# Patient Record
Sex: Male | Born: 1945
Health system: Southern US, Community
[De-identification: ages and names within clinical notes are randomized; demographics above are authoritative.]

## PROBLEM LIST (undated history)

## (undated) DIAGNOSIS — I10 Essential (primary) hypertension: Secondary | ICD-10-CM

## (undated) DIAGNOSIS — Z8719 Personal history of other diseases of the digestive system: Secondary | ICD-10-CM

## (undated) DIAGNOSIS — C801 Malignant (primary) neoplasm, unspecified: Secondary | ICD-10-CM

## (undated) DIAGNOSIS — R350 Frequency of micturition: Secondary | ICD-10-CM

## (undated) DIAGNOSIS — E611 Iron deficiency: Secondary | ICD-10-CM

## (undated) DIAGNOSIS — N289 Disorder of kidney and ureter, unspecified: Secondary | ICD-10-CM

## (undated) DIAGNOSIS — K529 Noninfective gastroenteritis and colitis, unspecified: Secondary | ICD-10-CM

## (undated) DIAGNOSIS — M1990 Unspecified osteoarthritis, unspecified site: Secondary | ICD-10-CM

## (undated) DIAGNOSIS — N400 Enlarged prostate without lower urinary tract symptoms: Secondary | ICD-10-CM

## (undated) DIAGNOSIS — R351 Nocturia: Secondary | ICD-10-CM

## (undated) HISTORY — DX: Noninfective gastroenteritis and colitis, unspecified: K52.9

## (undated) HISTORY — PX: ABDOMINAL SURGERY: SHX537

---

## 2008-01-04 HISTORY — PX: OTHER SURGICAL HISTORY: SHX169

## 2011-01-03 ENCOUNTER — Other Ambulatory Visit: Payer: Self-pay | Admitting: Urology

## 2011-01-25 ENCOUNTER — Encounter (HOSPITAL_BASED_OUTPATIENT_CLINIC_OR_DEPARTMENT_OTHER): Payer: Self-pay | Admitting: *Deleted

## 2011-01-25 NOTE — Progress Notes (Signed)
NPO AFTER MN. ARRIVES AT 0615. NEEDS HH AND EKG. REVIEWED [facility] GUIDELINES.

## 2011-01-28 NOTE — H&P (Signed)
History of Present Illness  Patient is here today for TURP. History below:  Thomas Mcclain returns for follow-up. He is status post his urodynamics. Again, we started seeing him earlier this year with some longstanding and progressive voiding symptoms. The patient did get some improvement with alpha-blocker therapy as well as some daily Cialis. When I saw him last time, however, he was interested potentially in pursuing things a little bit further. He still had nights where he was getting up 5 and 6 times per evening and while his stream was better it was still quite a bit on the weak side, at least occasionally. We felt that given the ongoing symptoms, that it would be appropriate to do additional assessment. Cystoscopically he was felt to have some outlet obstruction. The patient subsequently underwent video urodynamics.   The patient appeared to have difficult bladder emptying. He clearly had a hypersensitive bladder with some very mild instability but it was really of low pressure and no leakage occurred. On pressure flow studies the patient was clearly in the obstructed category with a void close to 250 cc and a maximum flow of 6 mL per second with a detrusor pressure of 81 cm of water pressure. Again, bladder emptying was good. There was no evidence of any external sphincter problems. In summary, he had a somewhat reduced functional bladder capacity that was clearly hypersensitive with very minimal instability. Pressure flow pattern was clearly obstructive. His clinical situation is unchanged since his last visit.    Past Medical History Problems  1. History of  Heartburn 787.1  Surgical History Problems  1. History of  Foot Surgery Left  Current Meds 1. Aspirin 81 MG Oral Tablet; Therapy: (Recorded:30Mar2012) to 2. Cialis 5 MG Oral Tablet; Take 1 tablet daily; Therapy: 30Mar2012 to (Evaluate:26Oct2012); Last  Rx:30Mar2012 3. Prevacid 15 MG Oral Capsule Delayed Release; Therapy:  (Recorded:26Nov2012) to 4. Tamsulosin HCl 0.4 MG Oral Capsule; TAKE 1 CAPSULE EVERY DAY; Therapy: 30Mar2012 to  (Last Rx:30Mar2012)  Allergies No Known Allergies  1. No Known Allergies  Family History Problems  1. Family history of  Family Health Status - Father's Age 79 2. Family history of  Family Health Status - Mother's Age 26 3. Family history of  Family Health Status Number Of Children 4 sons/ 1 daughter  Social History Problems  1. Marital History - Currently Married 2. Tobacco Use 305.1 smoked 2 pks for 20 yrs/ quit 33 yrs ago Denied  3. History of  Alcohol Use 4. History of  Caffeine Use  Review of Systems Genitourinary, constitutional, skin, eye, otolaryngeal, hematologic/lymphatic, cardiovascular, pulmonary, endocrine, musculoskeletal, gastrointestinal, neurological and psychiatric system(s) were reviewed and pertinent findings if present are noted.  Genitourinary: urinary frequency, feelings of urinary urgency, nocturia, difficulty starting the urinary stream, weak urinary stream, urinary stream starts and stops, incomplete emptying of bladder and erectile dysfunction, but no hematuria.  Gastrointestinal: heartburn.  Integumentary: pruritus.    Vitals Vital Signs [Data Includes: Last 1 Day]  27Dec2012 02:34PM  Blood Pressure: 127 / 78 Temperature: 98.4 F Heart Rate: 86  Physical Exam Constitutional: Well nourished and well developed . No acute distress.  ENT:. The ears and nose are normal in appearance.  Neck: The appearance of the neck is normal and no neck mass is present.  Pulmonary: No respiratory distress and normal respiratory rhythm and effort.  Cardiovascular: Heart rate and rhythm are normal . No peripheral edema.  Abdomen: The abdomen is soft and nontender. No masses are palpated. No CVA tenderness.  No hernias are palpable. No hepatosplenomegaly noted.  Genitourinary: Examination of the penis demonstrates no discharge, no masses, no lesions and a  normal meatus. The scrotum is without lesions. The right epididymis is palpably normal and non-tender. The left epididymis is palpably normal and non-tender. The right testis is non-tender and without masses. The left testis is non-tender and without masses.  Skin: Normal skin turgor, no visible rash and no visible skin lesions.    Assessment Assessed  1. Benign Prostatic Hypertrophy With Urinary Obstruction 600.01 2. Male Erectile Disorder Due To Physical Condition 607.84  Plan Benign Prostatic Hypertrophy With Urinary Obstruction (600.01)  1. Follow-up Schedule Surgery Office  Follow-up  Done: 27Dec2012  Discussion/Summary   Thomas Mcclain has had some very longstanding and progressive voiding symptoms. I think that both clinically, cystoscopically and based on urodynamics, he clearly has outlet obstruction for benign prostatic hyperplasia and I think his data is fairly compelling for that. Things are better on medical therapy but they are no where he wants to be. He is emptying his bladder and he understands that he is not at a point where he has to have something done, but he is quite interested in doing the next step towards a more definitive treatment of his problem. I do think he would be a very good candidate for a saline Gyrus TURP. Cystoscopically I felt that the majority of his obstruction appeared to be coming from a prominent median bar and we would hope to be able to just incise and/or resect that and potentially do a limited TUR. I think given his current preoperative numbers, he would have an excellent but certainly not 100% chance of substantial improvement in his voiding . Hopefully he could get off medical therapy and his situation would improve dramatically. We talked about the procedure, the recovery as well as some of the potential effects, good and bad. We did discuss retrograde ejaculation. Of note, he is currently enrolled in the ED study here in our office. We will arrange for surgery  sometime in the next several weeks based on review of his and my schedules.       Signatures Electronically signed by : Barron Alvine, M.D.; Dec 31 2010 11:46AM

## 2011-01-31 ENCOUNTER — Encounter (HOSPITAL_BASED_OUTPATIENT_CLINIC_OR_DEPARTMENT_OTHER): Payer: Self-pay | Admitting: Anesthesiology

## 2011-01-31 ENCOUNTER — Ambulatory Visit (HOSPITAL_BASED_OUTPATIENT_CLINIC_OR_DEPARTMENT_OTHER)
Admission: RE | Admit: 2011-01-31 | Discharge: 2011-02-01 | Disposition: A | Discharge status: Home / Self Care | Payer: BC Managed Care – PPO | Source: Ambulatory Visit | Attending: Urology | Admitting: Urology

## 2011-01-31 ENCOUNTER — Encounter (HOSPITAL_BASED_OUTPATIENT_CLINIC_OR_DEPARTMENT_OTHER): Payer: Self-pay | Admitting: *Deleted

## 2011-01-31 ENCOUNTER — Other Ambulatory Visit: Payer: Self-pay

## 2011-01-31 ENCOUNTER — Ambulatory Visit (HOSPITAL_BASED_OUTPATIENT_CLINIC_OR_DEPARTMENT_OTHER): Payer: BC Managed Care – PPO | Admitting: Anesthesiology

## 2011-01-31 ENCOUNTER — Other Ambulatory Visit: Payer: Self-pay | Admitting: Urology

## 2011-01-31 ENCOUNTER — Encounter (HOSPITAL_BASED_OUTPATIENT_CLINIC_OR_DEPARTMENT_OTHER)
Admission: RE | Disposition: A | Discharge status: Home / Self Care | Payer: Self-pay | Source: Ambulatory Visit | Attending: Urology

## 2011-01-31 DIAGNOSIS — N400 Enlarged prostate without lower urinary tract symptoms: Secondary | ICD-10-CM | POA: Diagnosis present

## 2011-01-31 DIAGNOSIS — N138 Other obstructive and reflux uropathy: Secondary | ICD-10-CM | POA: Insufficient documentation

## 2011-01-31 DIAGNOSIS — Z7982 Long term (current) use of aspirin: Secondary | ICD-10-CM | POA: Insufficient documentation

## 2011-01-31 DIAGNOSIS — R12 Heartburn: Secondary | ICD-10-CM | POA: Insufficient documentation

## 2011-01-31 DIAGNOSIS — N320 Bladder-neck obstruction: Secondary | ICD-10-CM | POA: Insufficient documentation

## 2011-01-31 DIAGNOSIS — Z79899 Other long term (current) drug therapy: Secondary | ICD-10-CM | POA: Insufficient documentation

## 2011-01-31 DIAGNOSIS — N401 Enlarged prostate with lower urinary tract symptoms: Secondary | ICD-10-CM | POA: Insufficient documentation

## 2011-01-31 HISTORY — DX: Nocturia: R35.1

## 2011-01-31 HISTORY — PX: TRANSURETHRAL RESECTION OF PROSTATE: SHX73

## 2011-01-31 HISTORY — DX: Iron deficiency: E61.1

## 2011-01-31 HISTORY — DX: Benign prostatic hyperplasia without lower urinary tract symptoms: N40.0

## 2011-01-31 HISTORY — DX: Frequency of micturition: R35.0

## 2011-01-31 SURGERY — TRANSURETHRAL RESECTION OF THE PROSTATE WITH GYRUS INSTRUMENTS
Anesthesia: General

## 2011-01-31 MED ORDER — FENTANYL CITRATE 0.05 MG/ML IJ SOLN
INTRAMUSCULAR | Status: DC | PRN
  Administered 2011-01-31 (×4): 50 ug via INTRAVENOUS

## 2011-01-31 MED ORDER — CIPROFLOXACIN IN D5W 400 MG/200ML IV SOLN
400.0000 mg | INTRAVENOUS | Status: AC
  Administered 2011-01-31: 400 mg via INTRAVENOUS

## 2011-01-31 MED ORDER — DOCUSATE SODIUM 100 MG PO CAPS
100.0000 mg | ORAL_CAPSULE | Freq: Two times a day (BID) | ORAL | Status: DC
  Administered 2011-01-31: 100 mg via ORAL

## 2011-01-31 MED ORDER — LACTATED RINGERS IV SOLN
INTRAVENOUS | Status: DC
  Administered 2011-01-31: 07:00:00 via INTRAVENOUS

## 2011-01-31 MED ORDER — KCL IN DEXTROSE-NACL 20-5-0.45 MEQ/L-%-% IV SOLN
INTRAVENOUS | Status: DC
  Administered 2011-01-31 (×2): via INTRAVENOUS

## 2011-01-31 MED ORDER — PROPOFOL 10 MG/ML IV EMUL
INTRAVENOUS | Status: DC | PRN
  Administered 2011-01-31: 180 mg via INTRAVENOUS

## 2011-01-31 MED ORDER — FENTANYL CITRATE 0.05 MG/ML IJ SOLN: 25.0000 ug | INTRAMUSCULAR | Status: DC | PRN

## 2011-01-31 MED ORDER — OXYBUTYNIN CHLORIDE 5 MG PO TABS: 5.0000 mg | ORAL_TABLET | Freq: Three times a day (TID) | ORAL | Status: DC | PRN

## 2011-01-31 MED ORDER — MIDAZOLAM HCL 5 MG/5ML IJ SOLN
INTRAMUSCULAR | Status: DC | PRN
  Administered 2011-01-31: 2 mg via INTRAVENOUS

## 2011-01-31 MED ORDER — TAMSULOSIN HCL 0.4 MG PO CAPS
0.4000 mg | ORAL_CAPSULE | Freq: Every day | ORAL | Status: DC
  Administered 2011-01-31: 0.4 mg via ORAL

## 2011-01-31 MED ORDER — PROMETHAZINE HCL 25 MG/ML IJ SOLN: 6.2500 mg | INTRAMUSCULAR | Status: DC | PRN

## 2011-01-31 MED ORDER — ONDANSETRON HCL 4 MG/2ML IJ SOLN: 4.0000 mg | INTRAMUSCULAR | Status: DC | PRN

## 2011-01-31 MED ORDER — STERILE WATER FOR IRRIGATION IR SOLN
Status: DC | PRN
  Administered 2011-01-31: 1

## 2011-01-31 MED ORDER — MORPHINE SULFATE 2 MG/ML IJ SOLN: 2.0000 mg | INTRAMUSCULAR | Status: DC | PRN

## 2011-01-31 MED ORDER — LIDOCAINE HCL 2 % EX GEL
CUTANEOUS | Status: DC | PRN
  Administered 2011-01-31: 1 via URETHRAL

## 2011-01-31 MED ORDER — ONDANSETRON HCL 4 MG/2ML IJ SOLN
INTRAMUSCULAR | Status: DC | PRN
  Administered 2011-01-31: 4 mg via INTRAVENOUS

## 2011-01-31 MED ORDER — HYDROCODONE-ACETAMINOPHEN 5-325 MG PO TABS
1.0000 | ORAL_TABLET | ORAL | Status: DC | PRN
  Administered 2011-01-31 – 2011-02-01 (×4): 2 via ORAL

## 2011-01-31 MED ORDER — SODIUM CHLORIDE 0.9 % IR SOLN
Status: DC | PRN
  Administered 2011-01-31: 3000 mL

## 2011-01-31 MED ORDER — CIPROFLOXACIN HCL 500 MG PO TABS
500.0000 mg | ORAL_TABLET | Freq: Two times a day (BID) | ORAL | Status: DC
  Administered 2011-01-31: 500 mg via ORAL

## 2011-01-31 MED ORDER — BELLADONNA ALKALOIDS-OPIUM 16.2-60 MG RE SUPP
RECTAL | Status: DC | PRN
  Administered 2011-01-31: 1 via RECTAL

## 2011-01-31 MED ORDER — LACTATED RINGERS IV SOLN: INTRAVENOUS | Status: DC

## 2011-01-31 MED ORDER — DEXAMETHASONE SODIUM PHOSPHATE 4 MG/ML IJ SOLN
INTRAMUSCULAR | Status: DC | PRN
  Administered 2011-01-31: 4 mg via INTRAVENOUS

## 2011-01-31 MED ORDER — LIDOCAINE HCL (CARDIAC) 20 MG/ML IV SOLN
INTRAVENOUS | Status: DC | PRN
  Administered 2011-01-31: 100 mg via INTRAVENOUS

## 2011-01-31 SURGICAL SUPPLY — 28 items
BAG DRAIN URO-CYSTO SKYTR STRL (DRAIN) ×2 IMPLANT
BAG URINE DRAINAGE (UROLOGICAL SUPPLIES) IMPLANT
BAG URINE LEG 19OZ MD ST LTX (BAG) IMPLANT
CANISTER SUCT LVC 12 LTR MEDI- (MISCELLANEOUS) ×4 IMPLANT
CATH FOLEY 2WAY SLVR  5CC 20FR (CATHETERS)
CATH FOLEY 2WAY SLVR  5CC 22FR (CATHETERS)
CATH FOLEY 2WAY SLVR 30CC 22FR (CATHETERS) ×2 IMPLANT
CATH FOLEY 2WAY SLVR 5CC 20FR (CATHETERS) IMPLANT
CATH FOLEY 2WAY SLVR 5CC 22FR (CATHETERS) IMPLANT
CATH FOLEY 3WAY 30CC 22F (CATHETERS) IMPLANT
CLOTH BEACON ORANGE TIMEOUT ST (SAFETY) ×2 IMPLANT
DRAPE CAMERA CLOSED 9X96 (DRAPES) ×2 IMPLANT
ELECT BUTTON HF 24-28F 2 30DE (ELECTRODE) IMPLANT
ELECT LOOP MED HF 24F 12D CBL (CLIP) ×2 IMPLANT
ELECT REM PT RETURN 9FT ADLT (ELECTROSURGICAL)
ELECT RESECT VAPORIZE 12D CBL (ELECTRODE) IMPLANT
ELECTRODE REM PT RTRN 9FT ADLT (ELECTROSURGICAL) IMPLANT
EVACUATOR MICROVAS BLADDER (UROLOGICAL SUPPLIES) IMPLANT
GLOVE BIO SURGEON STRL SZ7.5 (GLOVE) ×2 IMPLANT
GLOVE BIOGEL M 6.5 STRL (GLOVE) ×2 IMPLANT
GLOVE ECLIPSE 6.0 STRL STRAW (GLOVE) ×2 IMPLANT
GOWN STRL REIN XL XLG (GOWN DISPOSABLE) ×2 IMPLANT
HOLDER FOLEY CATH W/STRAP (MISCELLANEOUS) ×2 IMPLANT
KIT ASPIRATION TUBING (SET/KITS/TRAYS/PACK) ×4 IMPLANT
PACK CYSTOSCOPY (CUSTOM PROCEDURE TRAY) ×2 IMPLANT
PLUG CATH AND CAP STER (CATHETERS) IMPLANT
SYR 30ML LL (SYRINGE) ×2 IMPLANT
SYRINGE IRR TOOMEY STRL 70CC (SYRINGE) ×2 IMPLANT

## 2011-01-31 NOTE — Op Note (Signed)
Preoperative diagnosis: BPH Postoperative diagnosis: Same  Procedure: TURP with gyrus   Surgeon: Valetta Fuller M.D.  Anesthesia: Gen.  Indications: Patient has had long-standing progressive voiding symptoms. He has been on alpha blocker therapy as well as daily Cialis. This has resulted in overall improvement in his voiding status but not to the satisfaction of the patient. Video urodynamics were performed which on pressure flow studies did confirm outlet obstruction. The patient underwent consultation with regard to treatment options and elected to proceed with TURP. He appeared to understand a distinct advantages as well as disadvantages and potential complications of this procedure. He is received perioperative antibiotics and placement of PAS compression boots.     Technique and findings: Patient was brought to the operating room where he had successful induction of general anesthesia. He was placed in lithotomy position and then prepped and draped in usual manner. The 77 French continuous flow resectoscope was utilized. The gyrus instrumentation was used. Saline was used as an irrigant. The patient had trilobar hyperplasia with visual obstruction and a very high riding median bar. Prostatic urethra measured about 3 cm. Ureteral orifices were easily identified and preserved throughout the resection. Initial resection was performed at the level of the bladder neck which was then taken down the capsular fibers circumferentially. The right lobe of the prostate was then taken down from anterior to posterior out to the verumontanum the prostate. Extreme apical tissue was not resected. The same procedure was done on the contralateral side. Prostate chips were removed and sent for pathologic analysis. A 22 French Foley catheter was inserted and urine was light pink at the completion of the procedure. No obvious complications occurred the patient was brought to recovery room in stable condition.

## 2011-01-31 NOTE — Anesthesia Procedure Notes (Signed)
Procedure Name: LMA Insertion Performed by: Seraphim Affinito Pre-anesthesia Checklist: Patient identified, Emergency Drugs available, Suction available and Patient being monitored Patient Re-evaluated:Patient Re-evaluated prior to inductionOxygen Delivery Method: Circle System Utilized Preoxygenation: Pre-oxygenation with 100% oxygen Intubation Type: IV induction Ventilation: Mask ventilation without difficulty LMA: LMA inserted LMA Size: 5.0 Number of attempts: 1 Airway Equipment and Method: bite block Placement Confirmation: positive ETCO2 Dental Injury: Teeth and Oropharynx as per pre-operative assessment      

## 2011-01-31 NOTE — Interval H&P Note (Signed)
History and Physical Interval Note:  01/31/2011 7:35 AM  Thomas Mcclain  has presented today for surgery, with the diagnosis of Benign Prostatic Hypertrophy  The various methods of treatment have been discussed with the patient and family. After consideration of risks, benefits and other options for treatment, the patient has consented to  Procedure(s): TRANSURETHRAL RESECTION OF THE PROSTATE WITH GYRUS INSTRUMENTS as a surgical intervention .  The patients' history has been reviewed, patient examined, no change in status, stable for surgery.  I have reviewed the patients' chart and labs.  Questions were answered to the patient's satisfaction.     [name] S

## 2011-01-31 NOTE — Transfer of Care (Signed)
Immediate Anesthesia Transfer of Care Note  Patient: Thomas Mcclain  Procedure(s) Performed:  TRANSURETHRAL RESECTION OF THE PROSTATE WITH GYRUS INSTRUMENTS - GYRUS OWER  Patient Location: PACU  Anesthesia Type: General  Level of Consciousness: awake, alert  and oriented  Airway & Oxygen Therapy: Patient Spontanous Breathing and Patient connected to face mask oxygen  Post-op Assessment: Report given to PACU RN  Post vital signs: Reviewed and stable  Complications: No apparent anesthesia complications

## 2011-01-31 NOTE — Anesthesia Postprocedure Evaluation (Signed)
Anesthesia Post Note  Patient: Thomas Mcclain  Procedure(s) Performed:  TRANSURETHRAL RESECTION OF THE PROSTATE WITH GYRUS INSTRUMENTS - GYRUS OWER  Anesthesia type: General  Patient location: PACU  Post pain: Pain level controlled  Post assessment: Post-op Vital signs reviewed  Last Vitals:  Filed Vitals:   01/31/11 0900  BP: 104/81  Pulse: 52  Temp:   Resp: 14    Post vital signs: Reviewed  Level of consciousness: sedated  Complications: No apparent anesthesia complications

## 2011-01-31 NOTE — Anesthesia Preprocedure Evaluation (Addendum)
Anesthesia Evaluation  Patient identified by MRN, date of birth, ID band Patient awake    Reviewed: Allergy & Precautions, H&P , NPO status , Patient's Chart, lab work & pertinent test results  History of Anesthesia Complications Negative for: history of anesthetic complications  Airway Mallampati: II TM Distance: <3 FB Neck ROM: Full  Mouth opening: Limited Mouth Opening  Dental No notable dental hx. (+) Teeth Intact and Edentulous Upper   Pulmonary neg pulmonary ROS,  clear to auscultation  Pulmonary exam normal       Cardiovascular neg cardio ROS Regular Normal EKG consistent with prior anterior MI; patient denies any prior history of cardiac symptoms.   Neuro/Psych Negative Neurological ROS  Negative Psych ROS   GI/Hepatic negative GI ROS, Neg liver ROS,   Endo/Other  Negative Endocrine ROS  Renal/GU negative Renal ROS  Genitourinary negative   Musculoskeletal negative musculoskeletal ROS (+)   Abdominal   Peds negative pediatric ROS (+)  Hematology negative hematology ROS (+)   Anesthesia Other Findings Decreased oral radius with anterior larynx per exam; ? Difficult airway  Reproductive/Obstetrics negative OB ROS                           Anesthesia Physical Anesthesia Plan  ASA: II  Anesthesia Plan: General   Post-op Pain Management:    Induction: Intravenous  Airway Management Planned: LMA  Additional Equipment:   Intra-op Plan:   Post-operative Plan: Extubation in OR  Informed Consent: I have reviewed the patients History and Physical, chart, labs and discussed the procedure including the risks, benefits and alternatives for the proposed anesthesia with the patient or authorized representative who has indicated his/her understanding and acceptance.   Dental advisory given  Plan Discussed with: CRNA  Anesthesia Plan Comments:         Anesthesia Quick  Evaluation

## 2011-02-01 ENCOUNTER — Encounter (HOSPITAL_BASED_OUTPATIENT_CLINIC_OR_DEPARTMENT_OTHER): Payer: Self-pay | Admitting: Urology

## 2011-02-01 LAB — BASIC METABOLIC PANEL
CO2: 26 mEq/L (ref 19–32)
Chloride: 101 mEq/L (ref 96–112)
GFR calc Af Amer: >90 mL/min (ref >90)
Potassium: 4 mEq/L (ref 3.5–5.1)

## 2011-02-01 LAB — HEMOGLOBIN AND HEMATOCRIT, BLOOD
HCT: 36.7 % — ABNORMAL LOW (ref 39.0–52.0)
Hemoglobin: 12.4 g/dL — ABNORMAL LOW (ref 13.0–17.0)

## 2011-02-01 MED ORDER — CIPROFLOXACIN HCL 500 MG PO TABS: 250.0000 mg | ORAL_TABLET | Freq: Two times a day (BID) | ORAL | Status: AC

## 2011-02-01 MED ORDER — HYDROCODONE-ACETAMINOPHEN 5-325 MG PO TABS: 1.0000 | ORAL_TABLET | ORAL | Status: AC | PRN

## 2011-02-01 NOTE — Progress Notes (Signed)
Patient ID: Colsen Modi, male   DOB: [birth year], 66 y.o.   MRN: [id number] 1 Day Post-Op Subjective: Patient reports no pain. Doing well.  Objective: Vital signs in last 24 hours: Temp:  [96.7 F (35.9 C)-96.9 F (36.1 C)] 96.9 F (36.1 C) (01/29 0615) Pulse Rate:  [51-95] 72  (01/29 0615) Resp:  [7-30] 18  (01/29 0615) BP: (103-130)/(62-87) 114/72 mmHg (01/29 0615) SpO2:  [95 %-100 %] 95 % (01/29 0615)  Intake/Output from previous day: 01/28 0701 - 01/29 0700 In: 5062 [P.O.:3360; I.V.:1702] Out: 4875 [Urine:4875] Intake/Output this shift:    Physical Exam:  Constitutional: Vital signs reviewed. WD WN in NAD   Eyes: PERRL, No scleral icterus.   Cardiovascular: RRR Pulmonary/Chest: Normal effort Abdominal: Soft. Non-tender, non-distended, bowel sounds are normal, no masses, organomegaly, or guarding present.  Genitourinary:catheter ok  Extremities: No cyanosis or edema   Lab Results:  Basename 02/01/11 0500 01/31/11 0708  HGB 12.4* 14.6  HCT 36.7* 43.0   BMET  Basename 02/01/11 0500 01/31/11 0708  NA 134* 142  K 4.0 4.1  CL 101 --  CO2 26 --  GLUCOSE 133* 93  BUN 8 --  CREATININE 0.96 --  CALCIUM 8.4 --   No results found for this basename: LABPT:3,INR:3 in the last 72 hours No results found for this basename: LABURIN:1 in the last 72 hours No results found for this or any previous visit.  Studies/Results: No results found.  Assessment/Plan:   Doing well . Home today with foley.   LOS: 1 day   Ardella Chhim S 02/01/2011, 7:37 AM

## 2012-07-25 ENCOUNTER — Emergency Department (INDEPENDENT_AMBULATORY_CARE_PROVIDER_SITE_OTHER)
Admission: EM | Admit: 2012-07-25 | Discharge: 2012-07-25 | Disposition: A | Discharge status: Home / Self Care | Payer: Medicare Other | Source: Home / Self Care

## 2012-07-25 DIAGNOSIS — I10 Essential (primary) hypertension: Secondary | ICD-10-CM

## 2012-07-25 MED ORDER — LISINOPRIL-HYDROCHLOROTHIAZIDE 20-25 MG PO TABS: 1.0000 | ORAL_TABLET | Freq: Every day | ORAL | Status: DC

## 2012-07-25 NOTE — ED Provider Notes (Signed)
History    CSN: [id number] Arrival date & time 07/25/12  1753  First MD Initiated Contact with Patient 07/25/12 1906     Chief Complaint  Patient presents with  . Hypertension   (Consider location/radiation/quality/duration/timing/severity/associated sxs/prior Treatment) HPI   67 yo wm presents today with the above complaint.  States that he was at a friends house today and he checked his BP which was about 170/100.  Felt somewhat dizzy.  Denies headaches, vision changes, cp, palpitations, sob, nausea, vomiting, GI/GU issues.  Has not had a PCP in a few years and stopped his lisinopril about 2-3 years ago on his own.  Getting ready to start a new job and wants refill of his lisinopril.    Past Medical History  Diagnosis Date  . BPH (benign prostatic hyperplasia)   . Frequency of urination   . Nocturia   . Allergic rhinitis   . Iron deficiency    Past Surgical History  Procedure Laterality Date  . Left ankle debridement and fusion  2010  . Transurethral resection of prostate  01/31/2011    Procedure: TRANSURETHRAL RESECTION OF THE PROSTATE WITH GYRUS INSTRUMENTS;  Surgeon: Valetta Fuller, MD;  Location: [facility];  Service: Urology;  Laterality: N/A;  GYRUS OWER   No family history on file. History  Substance Use Topics  . Smoking status: Former Smoker    Types: Cigarettes    Quit date: 01/24/1977  . Smokeless tobacco: Never Used  . Alcohol Use: 8.4 oz/week    14 Cans of beer per week    Review of Systems  Constitutional: Negative.   HENT: Negative.   Eyes: Negative.   Respiratory: Negative.   Cardiovascular: Negative.   Gastrointestinal: Negative.   Endocrine: Negative.   Musculoskeletal: Negative.   Skin: Negative.   Neurological: Positive for dizziness. Negative for tremors, seizures, syncope, speech difficulty, weakness, light-headedness, numbness and headaches.  Psychiatric/Behavioral: Negative.     Allergies  Review of patient's allergies  indicates no known allergies.  Home Medications   Current Outpatient Rx  Name  Route  Sig  Dispense  Refill  . IRON PO   Oral   Take 1 tablet by mouth daily.         Marland Kitchen lisinopril-hydrochlorothiazide (PRINZIDE,ZESTORETIC) 20-25 MG per tablet   Oral   Take 1 tablet by mouth daily.   30 tablet   0     Must get established with a primary care physician ...   . loratadine-pseudoephedrine (CLARITIN-D 12-HOUR) 5-120 MG per tablet   Oral   Take 1 tablet by mouth as needed.         . Multiple Vitamin (MULTIVITAMIN) tablet   Oral   Take 1 tablet by mouth daily.         . Tamsulosin HCl (FLOMAX) 0.4 MG CAPS   Oral   Take 0.4 mg by mouth daily.          BP 160/100  Pulse 73  Temp(Src) 98.3 F (36.8 C) (Oral)  Resp 18  SpO2 97% Physical Exam  Constitutional: He is oriented to person, place, and time. He appears well-developed and well-nourished.  HENT:  Head: Normocephalic and atraumatic.  Eyes: EOM are normal. Pupils are equal, round, and reactive to light.  Neck: Normal range of motion.  Cardiovascular: Normal rate, regular rhythm and normal heart sounds.   Pulmonary/Chest: Effort normal and breath sounds normal.  Abdominal: Soft.  Musculoskeletal: Normal range of motion.  Neurological: He is alert  and oriented to person, place, and time.  Skin: Skin is warm and dry.  Psychiatric: He has a normal mood and affect.    ED Course  Procedures (including critical care time) Labs Reviewed - No data to display No results found. 1. Hypertension     MDM  Discussed with dr Artis Flock.  Patient given script for lisinopril/hctz 20/25mg  po daily.  No refills.  Advised that he must get established with a primary care and information was given.  Must get renal function rechecked with PCP in a couple of weeks.  Understands the risk of not being compliant with instructions.  All questions answered.    Meds ordered this encounter  Medications  . lisinopril-hydrochlorothiazide  (PRINZIDE,ZESTORETIC) 20-25 MG per tablet    Sig: Take 1 tablet by mouth daily.    Dispense:  30 tablet    Refill:  0    Must get established with a primary care physician and get your renal function rechecked in 2 weeks.    Zonia Kief, PA-C 07/25/12 1934

## 2012-07-25 NOTE — ED Notes (Signed)
Patient states he is here for BP.  Admits to be stressed due to wife health problems.  States he does have slight headache.  Been off of lisinopril for 2-3 years.

## 2012-07-25 NOTE — ED Provider Notes (Signed)
Medical screening examination/treatment/procedure(s) were performed by resident physician or non-physician practitioner and as supervising physician I was immediately available for consultation/collaboration.   Eleanor Dimichele DOUGLAS MD.   Garrison Michie D Ajahni Nay, MD 07/25/12 2030 

## 2012-07-26 LAB — POCT I-STAT, CHEM 8
BUN: 12 mg/dL (ref 6–23)
Chloride: 106 mEq/L (ref 96–112)
Creatinine, Ser: 1.3 mg/dL (ref 0.50–1.35)
Potassium: 4.1 mEq/L (ref 3.5–5.1)
Sodium: 143 mEq/L (ref 135–145)
TCO2: 25 mmol/L (ref 0–100)

## 2012-10-09 ENCOUNTER — Encounter (HOSPITAL_COMMUNITY): Payer: Self-pay | Admitting: Emergency Medicine

## 2012-10-09 ENCOUNTER — Emergency Department (INDEPENDENT_AMBULATORY_CARE_PROVIDER_SITE_OTHER)
Admission: EM | Admit: 2012-10-09 | Discharge: 2012-10-09 | Disposition: A | Discharge status: Home / Self Care | Payer: Medicare Other | Source: Home / Self Care | Attending: Family Medicine | Admitting: Family Medicine

## 2012-10-09 DIAGNOSIS — I10 Essential (primary) hypertension: Secondary | ICD-10-CM | POA: Diagnosis not present

## 2012-10-09 MED ORDER — LISINOPRIL-HYDROCHLOROTHIAZIDE 20-25 MG PO TABS: 1.0000 | ORAL_TABLET | Freq: Every day | ORAL | Status: DC

## 2012-10-09 NOTE — ED Notes (Signed)
Refill on bp medication

## 2012-10-09 NOTE — ED Provider Notes (Signed)
CSN: [id number]     Arrival date & time 10/09/12  1511 History   None    No chief complaint on file.  (Consider location/radiation/quality/duration/timing/severity/associated sxs/prior Treatment) Patient is a 67 y.o. male presenting with hypertension. The history is provided by the patient. No language interpreter was used.  Hypertension This is a chronic problem. The problem occurs constantly. The problem has not changed since onset.Pertinent negatives include no chest pain. Nothing aggravates the symptoms. Nothing relieves the symptoms.  Pt out of bp medication.  [facility]  Past Medical History  Diagnosis Date  . BPH (benign prostatic hyperplasia)   . Frequency of urination   . Nocturia   . Allergic rhinitis   . Iron deficiency    Past Surgical History  Procedure Laterality Date  . Left ankle debridement and fusion  2010  . Transurethral resection of prostate  01/31/2011    Procedure: TRANSURETHRAL RESECTION OF THE PROSTATE WITH GYRUS INSTRUMENTS;  Surgeon: Valetta Fuller, MD;  Location: [facility];  Service: Urology;  Laterality: N/A;  GYRUS OWER   No family history on file. History  Substance Use Topics  . Smoking status: Former Smoker    Types: Cigarettes    Quit date: 01/24/1977  . Smokeless tobacco: Never Used  . Alcohol Use: 8.4 oz/week    14 Cans of beer per week    Review of Systems  Cardiovascular: Negative for chest pain.  All other systems reviewed and are negative.    Allergies  Review of patient's allergies indicates no known allergies.  Home Medications   Current Outpatient Rx  Name  Route  Sig  Dispense  Refill  . IRON PO   Oral   Take 1 tablet by mouth daily.         Marland Kitchen lisinopril-hydrochlorothiazide (PRINZIDE,ZESTORETIC) 20-25 MG per tablet   Oral   Take 1 tablet by mouth daily.   30 tablet   0     Must get established with a primary care physician ...   . loratadine-pseudoephedrine  (CLARITIN-D 12-HOUR) 5-120 MG per tablet   Oral   Take 1 tablet by mouth as needed.         . Multiple Vitamin (MULTIVITAMIN) tablet   Oral   Take 1 tablet by mouth daily.         . Tamsulosin HCl (FLOMAX) 0.4 MG CAPS   Oral   Take 0.4 mg by mouth daily.          BP 127/82  Pulse 75  Temp(Src) 98.1 F (36.7 C) (Oral)  Resp 16  SpO2 99% Physical Exam  Constitutional: He appears well-developed and well-nourished.  HENT:  Head: Normocephalic.  Eyes: Pupils are equal, round, and reactive to light.  Neck: Normal range of motion.  Cardiovascular: Normal rate.   Pulmonary/Chest: Effort normal and breath sounds normal.  Abdominal: Soft.    ED Course  Procedures (including critical care time) Labs Review Labs Reviewed - No data to display Imaging Review No results found.  MDM   1. Hypertension    Lisinopril     Lonia Skinner [place], [place] 10/09/12 1636

## 2012-10-10 NOTE — ED Provider Notes (Signed)
Medical screening examination/treatment/procedure(s) were performed by resident physician or non-physician practitioner and as supervising physician I was immediately available for consultation/collaboration.   Barkley Bruns MD.   Linna Hoff, MD 10/10/12 2110

## 2012-10-17 ENCOUNTER — Ambulatory Visit: Payer: Medicare Other | Attending: Cardiology | Admitting: Internal Medicine

## 2012-10-17 ENCOUNTER — Encounter: Payer: Self-pay | Admitting: Internal Medicine

## 2012-10-17 VITALS — BP 121/78 | HR 70 | Temp 98.0°F | Resp 16 | Ht 72.0 in | Wt 196.0 lb

## 2012-10-17 DIAGNOSIS — D509 Iron deficiency anemia, unspecified: Secondary | ICD-10-CM | POA: Insufficient documentation

## 2012-10-17 DIAGNOSIS — Z0000 Encounter for general adult medical examination without abnormal findings: Secondary | ICD-10-CM | POA: Diagnosis not present

## 2012-10-17 DIAGNOSIS — R7309 Other abnormal glucose: Secondary | ICD-10-CM

## 2012-10-17 DIAGNOSIS — I10 Essential (primary) hypertension: Secondary | ICD-10-CM | POA: Diagnosis not present

## 2012-10-17 DIAGNOSIS — M549 Dorsalgia, unspecified: Secondary | ICD-10-CM | POA: Diagnosis not present

## 2012-10-17 LAB — LIPID PANEL
Cholesterol: 158 mg/dL (ref 0–200)
HDL: 35 mg/dL — ABNORMAL LOW (ref >39)
LDL Cholesterol: 98 mg/dL (ref 0–99)
Total CHOL/HDL Ratio: 4.5 Ratio
Triglycerides: 127 mg/dL (ref <150)
VLDL: 25 mg/dL (ref 0–40)

## 2012-10-17 MED ORDER — TAMSULOSIN HCL 0.4 MG PO CAPS: 0.4000 mg | ORAL_CAPSULE | Freq: Every day | ORAL | Status: DC

## 2012-10-17 MED ORDER — LISINOPRIL-HYDROCHLOROTHIAZIDE 20-25 MG PO TABS: 1.0000 | ORAL_TABLET | Freq: Every day | ORAL | Status: DC

## 2012-10-17 NOTE — Progress Notes (Signed)
Patient ID: Thomas Mcclain, male   DOB: 1945-11-21, 67 y.o.   MRN: [id number] PCP:  Jeanann Lewandowsky, MD   Chief Complaint:  Establish care  HPI: 67 year old me in week history of BPH status post TURP in 2013, hypertension recently started on lisinopril and HCTZ at the urgent care comes in to establish care in the clinic. Patient reports having some sharp pain over his left mid back occasionally radiating to the left upper chest for last 6 to 8 weeks. He thinks that he may have pulled a muscle with exertion and lifting some weights 2 weeks back. He reports having pain on lying down on his back for a long time. The pain is not severe. He denies any headache, blurred vision, dizziness, chest pain, shortness of breath, palpitations, fever, chills, abdominal pain, nausea, vomiting, bowel or urinary symptoms. denies any polyuria, polydipsia, change in his weight or appetite. Reports having a colonoscopy at [facility] about 6 years back and it reported to be normal. Denies any joint pains.  Allergies: No Known Allergies  Prior to Admission medications   Medication Sig Start Date End Date Taking? Authorizing Provider  lisinopril-hydrochlorothiazide (PRINZIDE,ZESTORETIC) 20-25 MG per tablet Take 1 tablet by mouth daily. 10/17/12  Yes Shylah Dossantos, MD  IRON PO Take 1 tablet by mouth daily.    Historical Provider, MD  loratadine-pseudoephedrine (CLARITIN-D 12-HOUR) 5-120 MG per tablet Take 1 tablet by mouth as needed.    Historical Provider, MD  Multiple Vitamin (MULTIVITAMIN) tablet Take 1 tablet by mouth daily.    Historical Provider, MD  tamsulosin (FLOMAX) 0.4 MG CAPS capsule Take 1 capsule (0.4 mg total) by mouth daily. 10/17/12   Eddie North, MD    Past Medical History  Diagnosis Date  . BPH (benign prostatic hyperplasia)   . Frequency of urination   . Nocturia   . Allergic rhinitis   . Iron deficiency     Past Surgical History  Procedure Laterality Date  . Left ankle debridement  and fusion  2010  . Transurethral resection of prostate  01/31/2011    Procedure: TRANSURETHRAL RESECTION OF THE PROSTATE WITH GYRUS INSTRUMENTS;  Surgeon: Valetta Fuller, MD;  Location: [facility];  Service: Urology;  Laterality: N/A;  GYRUS OWER    Social History:  reports that he quit smoking about 35 years ago. His smoking use included Cigarettes. He smoked 0.00 packs per day. He has never used smokeless tobacco. He reports that he drinks about 8.4 ounces of alcohol per week. He reports that he does not use illicit drugs.  Family History  Problem Relation Age of Onset  . Diabetes Mother     Review of Systems:  Constitutional: Denies fever, chills, diaphoresis, appetite change and fatigue.  HEENT: Denies photophobia, eye pain, redness, hearing loss, ear pain, congestion, sore throat, rhinorrhea, sneezing, mouth sores, trouble swallowing, neck pain, neck stiffness and tinnitus.   Respiratory: Denies SOB, DOE, cough, chest tightness,  and wheezing.   Cardiovascular: Denies chest pain, palpitations and leg swelling.  Gastrointestinal: Denies nausea, vomiting, abdominal pain, diarrhea, constipation, blood in stool and abdominal distention.  Genitourinary: Denies dysuria, urgency, frequency, hematuria, flank pain and difficulty urinating.  Endocrine: Denies: hot or cold intolerance, sweats,   polyuria, polydipsia. Musculoskeletal: Sharp pain over mid back,  Denies myalgias,  joint swelling, arthralgias and gait problem.  Skin: Denies pallor, rash and wound.  Neurological: Denies dizziness, seizures, syncope, weakness, light-headedness, numbness and headaches.  Hematological: Denies adenopathy. Psychiatric/Behavioral: Denies suicidal ideation, mood changes,  confusion, nervousness, sleep disturbance and agitation   Physical Exam:  Filed Vitals:   10/17/12 0917  BP: 121/78  Pulse: 70  Temp: 98 F (36.7 C)  TempSrc: Oral  Resp: 16  Height: 6' (1.829 m)  Weight: 196 lb  (88.905 kg)  SpO2: 94%    Constitutional: Vital signs reviewed.  Patient is a well-developed and well-nourished in no acute distress and cooperative with exam. HEENT: No pallor, no icterus, moist oral mucosa, no cervical lymphadenopathy Cardiovascular: RRR, S1 normal, S2 normal, no MRG, Pulmonary/Chest: CTAB, no wheezes, rales, or rhonchi Abdominal: Soft. Non-tender, non-distended, bowel sounds are normal, no masses, organomegaly, or guarding present.  Musculoskeletal: No joint deformities, erythema, or stiffness, ROM full and no nontender Ext: no edema and no cyanosis Neurological: A&O x3, nonfocal  Skin: Warm, dry and intact. No rash, cyanosis, or clubbing.  Psychiatric: Normal mood and affect. speech and behavior is normal. Judgment and thought content normal. Cognition and memory are normal.   Labs from July 2014 reviewed  Radiological Exams on Admission: No results found.  Assessment/Plan Benign essential hypertension Well controlled. Continue lisinopril and HCTZ. We will check repeat CBC and BMP in 6 months. Refill provided.  check lipid panel and A1C today  Back pain Appears musculoskeletal. Advised on range of motion exercise and Tylenol when necessary for pain   BPH Status post TURP P. date continue with Flomax  Iron deficiency anemia Hemoglobin 3 months back was 12.4. The started iron supplements at this time. We will recheck hemoglobin in 6 months.  Health maintenance Reports having a normal colonoscopy 6 months back.  This and plans on starting a new job at [employer] which involves working with machinery in a few weeks and he is medically clear.  follow up in 6 months  Jaeleen Inzunza 10/17/2012, 9:41 AM

## 2012-10-17 NOTE — Progress Notes (Signed)
Pt is here today to establish care. Pt reports having tightness of the chest for 3 weeks. Pt has been treated for HTN Pt is also having lower back pain for 4 weeks its throbbing and radiating down his right leg.

## 2012-10-19 ENCOUNTER — Telehealth: Payer: Self-pay | Admitting: Internal Medicine

## 2013-02-08 DIAGNOSIS — S61209A Unspecified open wound of unspecified finger without damage to nail, initial encounter: Secondary | ICD-10-CM | POA: Diagnosis not present

## 2013-02-08 DIAGNOSIS — G8911 Acute pain due to trauma: Secondary | ICD-10-CM | POA: Diagnosis not present

## 2013-03-26 DIAGNOSIS — H2510 Age-related nuclear cataract, unspecified eye: Secondary | ICD-10-CM | POA: Diagnosis not present

## 2013-03-26 DIAGNOSIS — H40039 Anatomical narrow angle, unspecified eye: Secondary | ICD-10-CM | POA: Diagnosis not present

## 2013-05-23 ENCOUNTER — Encounter (HOSPITAL_COMMUNITY): Payer: Self-pay | Admitting: Emergency Medicine

## 2013-05-23 ENCOUNTER — Encounter (HOSPITAL_COMMUNITY)
Admission: EM | Disposition: A | Discharge status: Home / Self Care | Payer: Self-pay | Source: Home / Self Care | Attending: Internal Medicine

## 2013-05-23 ENCOUNTER — Observation Stay (HOSPITAL_COMMUNITY)
Admission: EM | Admit: 2013-05-23 | Discharge: 2013-05-24 | Disposition: A | Discharge status: Home / Self Care | Payer: Medicare Other | Attending: Internal Medicine | Admitting: Internal Medicine

## 2013-05-23 DIAGNOSIS — R404 Transient alteration of awareness: Secondary | ICD-10-CM | POA: Diagnosis not present

## 2013-05-23 DIAGNOSIS — R748 Abnormal levels of other serum enzymes: Secondary | ICD-10-CM | POA: Diagnosis not present

## 2013-05-23 DIAGNOSIS — K449 Diaphragmatic hernia without obstruction or gangrene: Secondary | ICD-10-CM | POA: Diagnosis present

## 2013-05-23 DIAGNOSIS — K922 Gastrointestinal hemorrhage, unspecified: Secondary | ICD-10-CM | POA: Diagnosis present

## 2013-05-23 DIAGNOSIS — K209 Esophagitis, unspecified without bleeding: Secondary | ICD-10-CM | POA: Diagnosis present

## 2013-05-23 DIAGNOSIS — J309 Allergic rhinitis, unspecified: Secondary | ICD-10-CM | POA: Insufficient documentation

## 2013-05-23 DIAGNOSIS — Z87891 Personal history of nicotine dependence: Secondary | ICD-10-CM | POA: Insufficient documentation

## 2013-05-23 DIAGNOSIS — R42 Dizziness and giddiness: Secondary | ICD-10-CM | POA: Diagnosis not present

## 2013-05-23 DIAGNOSIS — N401 Enlarged prostate with lower urinary tract symptoms: Secondary | ICD-10-CM

## 2013-05-23 DIAGNOSIS — F1010 Alcohol abuse, uncomplicated: Secondary | ICD-10-CM | POA: Insufficient documentation

## 2013-05-23 DIAGNOSIS — K920 Hematemesis: Principal | ICD-10-CM | POA: Insufficient documentation

## 2013-05-23 DIAGNOSIS — R55 Syncope and collapse: Secondary | ICD-10-CM | POA: Diagnosis not present

## 2013-05-23 DIAGNOSIS — R350 Frequency of micturition: Secondary | ICD-10-CM | POA: Insufficient documentation

## 2013-05-23 DIAGNOSIS — D509 Iron deficiency anemia, unspecified: Secondary | ICD-10-CM | POA: Diagnosis present

## 2013-05-23 DIAGNOSIS — D62 Acute posthemorrhagic anemia: Secondary | ICD-10-CM | POA: Diagnosis present

## 2013-05-23 DIAGNOSIS — K259 Gastric ulcer, unspecified as acute or chronic, without hemorrhage or perforation: Secondary | ICD-10-CM | POA: Insufficient documentation

## 2013-05-23 DIAGNOSIS — I10 Essential (primary) hypertension: Secondary | ICD-10-CM | POA: Diagnosis present

## 2013-05-23 DIAGNOSIS — N138 Other obstructive and reflux uropathy: Secondary | ICD-10-CM | POA: Insufficient documentation

## 2013-05-23 HISTORY — DX: Essential (primary) hypertension: I10

## 2013-05-23 HISTORY — PX: ESOPHAGOGASTRODUODENOSCOPY: SHX5428

## 2013-05-23 LAB — I-STAT TROPONIN, ED: TROPONIN I, POC: 0 ng/mL (ref 0.00–0.08)

## 2013-05-23 LAB — CBC
HCT: 28.4 % — ABNORMAL LOW (ref 39.0–52.0)
HCT: 27.7 % — ABNORMAL LOW (ref 39.0–52.0)
HEMATOCRIT: 28.2 % — AB (ref 39.0–52.0)
HEMATOCRIT: 28 % — AB (ref 39.0–52.0)
HEMOGLOBIN: 9.6 g/dL — AB (ref 13.0–17.0)
Hemoglobin: 9.4 g/dL — ABNORMAL LOW (ref 13.0–17.0)
Hemoglobin: 9 g/dL — ABNORMAL LOW (ref 13.0–17.0)
Hemoglobin: 9.1 g/dL — ABNORMAL LOW (ref 13.0–17.0)
MCH: 26 pg (ref 26.0–34.0)
MCH: 25.4 pg — ABNORMAL LOW (ref 26.0–34.0)
MCH: 26.3 pg (ref 26.0–34.0)
MCH: 25.3 pg — AB (ref 26.0–34.0)
MCHC: 33.1 g/dL (ref 30.0–36.0)
MCHC: 32.5 g/dL (ref 30.0–36.0)
MCHC: 34 g/dL (ref 30.0–36.0)
MCHC: 32.5 g/dL (ref 30.0–36.0)
MCV: 78.5 fL (ref 78.0–100.0)
MCV: 78.2 fL (ref 78.0–100.0)
MCV: 77.3 fL — ABNORMAL LOW (ref 78.0–100.0)
MCV: 77.8 fL — AB (ref 78.0–100.0)
PLATELETS: 221 10*3/uL (ref 150–400)
PLATELETS: 206 10*3/uL (ref 150–400)
Platelets: 219 10*3/uL (ref 150–400)
Platelets: 225 10*3/uL (ref 150–400)
RBC: 3.62 MIL/uL — ABNORMAL LOW (ref 4.22–5.81)
RBC: 3.54 MIL/uL — AB (ref 4.22–5.81)
RBC: 3.65 MIL/uL — AB (ref 4.22–5.81)
RBC: 3.6 MIL/uL — AB (ref 4.22–5.81)
RDW: 15.8 % — ABNORMAL HIGH (ref 11.5–15.5)
RDW: 15.7 % — AB (ref 11.5–15.5)
RDW: 15.6 % — ABNORMAL HIGH (ref 11.5–15.5)
RDW: 15.7 % — ABNORMAL HIGH (ref 11.5–15.5)
WBC: 9.4 10*3/uL (ref 4.0–10.5)
WBC: 8.2 10*3/uL (ref 4.0–10.5)
WBC: 8.5 10*3/uL (ref 4.0–10.5)
WBC: 7.6 10*3/uL (ref 4.0–10.5)

## 2013-05-23 LAB — COMPREHENSIVE METABOLIC PANEL
ALK PHOS: 93 U/L (ref 39–117)
ALT: 14 U/L (ref 0–53)
ALT: 11 U/L (ref 0–53)
AST: 20 U/L (ref 0–37)
AST: 17 U/L (ref 0–37)
Albumin: 3.6 g/dL (ref 3.5–5.2)
Albumin: 3.2 g/dL — ABNORMAL LOW (ref 3.5–5.2)
Alkaline Phosphatase: 83 U/L (ref 39–117)
BUN: 35 mg/dL — ABNORMAL HIGH (ref 6–23)
BUN: 29 mg/dL — AB (ref 6–23)
CHLORIDE: 106 meq/L (ref 96–112)
CO2: 22 meq/L (ref 19–32)
CO2: 24 meq/L (ref 19–32)
CREATININE: 1.32 mg/dL (ref 0.50–1.35)
CREATININE: 1.19 mg/dL (ref 0.50–1.35)
Calcium: 8.9 mg/dL (ref 8.4–10.5)
Calcium: 8.4 mg/dL (ref 8.4–10.5)
Chloride: 108 mEq/L (ref 96–112)
GFR calc Af Amer: 63 mL/min — ABNORMAL LOW (ref >90)
GFR calc Af Amer: 71 mL/min — ABNORMAL LOW (ref >90)
GFR, EST NON AFRICAN AMERICAN: 54 mL/min — AB (ref >90)
GFR, EST NON AFRICAN AMERICAN: 61 mL/min — AB (ref >90)
Glucose, Bld: 114 mg/dL — ABNORMAL HIGH (ref 70–99)
Glucose, Bld: 103 mg/dL — ABNORMAL HIGH (ref 70–99)
POTASSIUM: 4.3 meq/L (ref 3.7–5.3)
Potassium: 4.5 mEq/L (ref 3.7–5.3)
Sodium: 141 mEq/L (ref 137–147)
Sodium: 142 mEq/L (ref 137–147)
Total Bilirubin: 0.2 mg/dL — ABNORMAL LOW (ref 0.3–1.2)
Total Protein: 7.1 g/dL (ref 6.0–8.3)
Total Protein: 6.2 g/dL (ref 6.0–8.3)

## 2013-05-23 LAB — URINALYSIS, ROUTINE W REFLEX MICROSCOPIC
BILIRUBIN URINE: NEGATIVE
GLUCOSE, UA: NEGATIVE mg/dL
HGB URINE DIPSTICK: NEGATIVE
Ketones, ur: NEGATIVE mg/dL
Leukocytes, UA: NEGATIVE
Nitrite: NEGATIVE
PH: 5 (ref 5.0–8.0)
Protein, ur: NEGATIVE mg/dL
SPECIFIC GRAVITY, URINE: 1.027 (ref 1.005–1.030)
UROBILINOGEN UA: 0.2 mg/dL (ref 0.0–1.0)

## 2013-05-23 LAB — CBC WITH DIFFERENTIAL/PLATELET
BASOS ABS: 0 10*3/uL (ref 0.0–0.1)
Basophils Relative: 0 % (ref 0–1)
Eosinophils Absolute: 0.6 10*3/uL (ref 0.0–0.7)
Eosinophils Relative: 5 % (ref 0–5)
HEMATOCRIT: 32.5 % — AB (ref 39.0–52.0)
HEMOGLOBIN: 10.6 g/dL — AB (ref 13.0–17.0)
LYMPHS PCT: 14 % (ref 12–46)
Lymphs Abs: 1.8 10*3/uL (ref 0.7–4.0)
MCH: 25.8 pg — ABNORMAL LOW (ref 26.0–34.0)
MCHC: 32.6 g/dL (ref 30.0–36.0)
MCV: 79.1 fL (ref 78.0–100.0)
MONO ABS: 0.8 10*3/uL (ref 0.1–1.0)
MONOS PCT: 6 % (ref 3–12)
NEUTROS ABS: 9.2 10*3/uL — AB (ref 1.7–7.7)
NEUTROS PCT: 75 % (ref 43–77)
Platelets: 254 10*3/uL (ref 150–400)
RBC: 4.11 MIL/uL — ABNORMAL LOW (ref 4.22–5.81)
RDW: 15.8 % — AB (ref 11.5–15.5)
WBC: 12.5 10*3/uL — AB (ref 4.0–10.5)

## 2013-05-23 LAB — CBG MONITORING, ED
GLUCOSE-CAPILLARY: 81 mg/dL (ref 70–99)
Glucose-Capillary: 115 mg/dL — ABNORMAL HIGH (ref 70–99)

## 2013-05-23 LAB — POC OCCULT BLOOD, ED: FECAL OCCULT BLD: POSITIVE — AB

## 2013-05-23 LAB — TROPONIN I

## 2013-05-23 LAB — HEMOGLOBIN: Hemoglobin: 9.1 g/dL — ABNORMAL LOW (ref 13.0–17.0)

## 2013-05-23 LAB — TYPE AND SCREEN
ABO/RH(D): B POS
ANTIBODY SCREEN: NEGATIVE

## 2013-05-23 LAB — SAMPLE TO BLOOD BANK

## 2013-05-23 LAB — LIPASE, BLOOD: Lipase: 63 U/L — ABNORMAL HIGH (ref 11–59)

## 2013-05-23 LAB — ABO/RH: ABO/RH(D): B POS

## 2013-05-23 LAB — I-STAT CG4 LACTIC ACID, ED: Lactic Acid, Venous: 0.6 mmol/L (ref 0.5–2.2)

## 2013-05-23 SURGERY — EGD (ESOPHAGOGASTRODUODENOSCOPY)
Anesthesia: Moderate Sedation

## 2013-05-23 MED ORDER — SODIUM CHLORIDE 0.9 % IV SOLN
1000.0000 mL | INTRAVENOUS | Status: DC
  Administered 2013-05-23: 1000 mL via INTRAVENOUS

## 2013-05-23 MED ORDER — BUTAMBEN-TETRACAINE-BENZOCAINE 2-2-14 % EX AERO
INHALATION_SPRAY | CUTANEOUS | Status: DC | PRN
  Administered 2013-05-23: 2 via TOPICAL

## 2013-05-23 MED ORDER — PANTOPRAZOLE SODIUM 40 MG IV SOLR
80.0000 mg | Freq: Once | INTRAVENOUS | Status: AC
  Administered 2013-05-23: 80 mg via INTRAVENOUS
  Filled 2013-05-23 (×2): qty 80

## 2013-05-23 MED ORDER — SODIUM CHLORIDE 0.9 % IV SOLN
INTRAVENOUS | Status: AC
  Administered 2013-05-24 (×2): via INTRAVENOUS

## 2013-05-23 MED ORDER — LORAZEPAM 2 MG/ML IJ SOLN: 1.0000 mg | Freq: Four times a day (QID) | INTRAMUSCULAR | Status: DC | PRN

## 2013-05-23 MED ORDER — SODIUM CHLORIDE 0.9 % IV SOLN
1000.0000 mL | Freq: Once | INTRAVENOUS | Status: AC
  Administered 2013-05-23: 1000 mL via INTRAVENOUS

## 2013-05-23 MED ORDER — FENTANYL CITRATE 0.05 MG/ML IJ SOLN
INTRAMUSCULAR | Status: AC
  Filled 2013-05-23: qty 2

## 2013-05-23 MED ORDER — LORAZEPAM 1 MG PO TABS: 1.0000 mg | ORAL_TABLET | Freq: Four times a day (QID) | ORAL | Status: DC | PRN

## 2013-05-23 MED ORDER — FENTANYL CITRATE 0.05 MG/ML IJ SOLN
INTRAMUSCULAR | Status: DC | PRN
  Administered 2013-05-23 (×2): 25 ug via INTRAVENOUS

## 2013-05-23 MED ORDER — MIDAZOLAM HCL 5 MG/ML IJ SOLN
INTRAMUSCULAR | Status: AC
  Filled 2013-05-23: qty 2

## 2013-05-23 MED ORDER — PANTOPRAZOLE SODIUM 40 MG IV SOLR: 40.0000 mg | Freq: Two times a day (BID) | INTRAVENOUS | Status: DC

## 2013-05-23 MED ORDER — VITAMIN B-1 100 MG PO TABS
100.0000 mg | ORAL_TABLET | Freq: Every day | ORAL | Status: DC
  Administered 2013-05-23 – 2013-05-24 (×2): 100 mg via ORAL
  Filled 2013-05-23 (×2): qty 1

## 2013-05-23 MED ORDER — HYDRALAZINE HCL 20 MG/ML IJ SOLN: 10.0000 mg | INTRAMUSCULAR | Status: DC | PRN

## 2013-05-23 MED ORDER — ACETAMINOPHEN 325 MG PO TABS: 650.0000 mg | ORAL_TABLET | Freq: Four times a day (QID) | ORAL | Status: DC | PRN

## 2013-05-23 MED ORDER — MIDAZOLAM HCL 10 MG/2ML IJ SOLN
INTRAMUSCULAR | Status: DC | PRN
  Administered 2013-05-23 (×2): 2 mg via INTRAVENOUS

## 2013-05-23 MED ORDER — ONDANSETRON HCL 4 MG PO TABS: 4.0000 mg | ORAL_TABLET | Freq: Four times a day (QID) | ORAL | Status: DC | PRN

## 2013-05-23 MED ORDER — THIAMINE HCL 100 MG/ML IJ SOLN
100.0000 mg | Freq: Every day | INTRAMUSCULAR | Status: DC
  Filled 2013-05-23 (×2): qty 1

## 2013-05-23 MED ORDER — SODIUM CHLORIDE 0.9 % IV SOLN
8.0000 mg/h | INTRAVENOUS | Status: DC
  Administered 2013-05-23: 8 mg/h via INTRAVENOUS
  Filled 2013-05-23 (×5): qty 80

## 2013-05-23 MED ORDER — FOLIC ACID 1 MG PO TABS
1.0000 mg | ORAL_TABLET | Freq: Every day | ORAL | Status: DC
  Administered 2013-05-23 – 2013-05-24 (×2): 1 mg via ORAL
  Filled 2013-05-23 (×2): qty 1

## 2013-05-23 MED ORDER — ADULT MULTIVITAMIN W/MINERALS CH
1.0000 | ORAL_TABLET | Freq: Every day | ORAL | Status: DC
  Administered 2013-05-23 – 2013-05-24 (×2): 1 via ORAL
  Filled 2013-05-23 (×2): qty 1

## 2013-05-23 MED ORDER — ONDANSETRON HCL 4 MG/2ML IJ SOLN: 4.0000 mg | Freq: Four times a day (QID) | INTRAMUSCULAR | Status: DC | PRN

## 2013-05-23 MED ORDER — ACETAMINOPHEN 650 MG RE SUPP: 650.0000 mg | Freq: Four times a day (QID) | RECTAL | Status: DC | PRN

## 2013-05-23 NOTE — Op Note (Addendum)
[facility] [place], [id number]   ENDOSCOPY PROCEDURE REPORT  PATIENT: Thomas Mcclain, Thomas Mcclain  MR#: [id number] BIRTHDATE: [birth year] , 68  yrs. old GENDER: Male ENDOSCOPIST: Ladene Artist, MD, [facility] REFERRED BY:  [facility] PROCEDURE DATE:  05/23/2013 PROCEDURE:  EGD, diagnostic ASA CLASS:     Class II INDICATIONS:  Hematemesis. MEDICATIONS: These medications were titrated to patient response per physician's verbal order, Fentanyl 50 mcg IV, and Versed 4 mg IV TOPICAL ANESTHETIC: Cetacaine Spray DESCRIPTION OF PROCEDURE: After the risks benefits and alternatives of the procedure were thoroughly explained, informed consent was obtained.  The Pentax Gastroscope [id number] endoscope was introduced through the mouth and advanced to the second portion of the duodenum. Without limitations.  The instrument was slowly withdrawn as the mucosa was fully examined.  ESOPHAGUS: There was LA Class B esophagitis noted.   The esophagus was otherwise normal. STOMACH: Multiple non-bleeding linear and shallow ulcers associated with a large hiatal hernia in the fundus. One ulcer had a red spot. The stomach otherwise appeared normal. DUODENUM: The duodenal mucosa showed no abnormalities in the bulb and second portion of the duodenum.  Retroflexed views revealed a large hiatal hernia, 6-7 cm.   The scope was then withdrawn from the patient and the procedure completed.  COMPLICATIONS: There were no complications.  ENDOSCOPIC IMPRESSION: 1.   LA Class B esophagitis 2.   Multiple non-bleeding linear ulcers in the gastric fundus associated with the hiatal hernia 3.   Large, 6-7 cm, hiatal hernia  RECOMMENDATIONS: 1.  Continue PPI infusion for now the PPI BID 2.  Anti-reflux regimen 3.  Recommend elective hiatal hernia repair in near future. Outpatient surigcal referral.  eSigned:  Ladene Artist, MD, [facility] 05/23/2013 1:35 PM

## 2013-05-23 NOTE — ED Notes (Signed)
Received report post EGD procedure from PACU  During procedure patient received the following medications:  - Received 50 mcg of fentanyl - 4 mg of versed - synthecan spray  Results: Esophagitis, large hiatal hernial Non-bleed linear shallow ulcers. Patient is alert and oriented  Current VS: BP: 109/69, HR: 77 NSR, RR: 17, SpO2: 97%

## 2013-05-23 NOTE — Progress Notes (Signed)
S; patient admitted at 0700 on 5/21; 68 y.o. male with history of hypertension and BPH had an episode of loss of consciousness at his workplace last night. Patient states that he suddenly became diaphoretic and was consciousness. His colleagues made him lie down and he regained his consciousness and when he stood up he had another episode. He did not have any tongue bite or incontinence of urine. Denies any chest pain or shortness of breath. No focal deficits. After the second episode patient throughout twice and it was bloody. EMS was called and as per the ER physician patient was hypotensive at the site. When patient came to the ER his blood pressure was in the low normal around 90 systolic and improved with 1 L normal saline bolus. Blood work showed hemoglobin of 10 and his previous one was around 14-15 last year. Stool for occult blood is positive. Patient takes aspirin and Excedrin. Denies taking any Motrin or Advil. Has not noticed any blood in his stools or black stools. Denies any abdominal pain or diarrhea. Patient states she has had a colonoscopy a few years ago(does not remember the exact date) which has per the patient was normal. Patient has been started on Protonix infusion and will be admitted GI bleed. Patient states that he drinks around one to 2 can of beer 2-3 times a week.   O;  General: A/O x 4, NAD  Well-developed and nourished.   Neck: Neg JVD/ Lymphadenopathy Cardiovascular: RRR, (-) M/R/G, normal S1-S2   Respiratory: CTA Bilat , (-) wheeze Abdomen: Soft nontender, (+)  bowel sounds present. No guarding or rigidity.    Assessment/Plan  Principal Problem:  Upper gastrointestinal bleed  Active Problems:  Essential hypertension, benign  Anemia, iron deficiency  GI bleed    GI bleed most likely upper GI  - patient has been kept n.p.o. Protonix infusion.  -Closely follow CBC.  -Continue gentle hydration.  -patient had at least 4-5 g of hemoglobin drop and was initially  hypotensive we will monitor patient in step down.  -Patient seen by GI  S/P . Endoscopy which showed the following;  1. LA Class B esophagitis   2. Multiple non-bleeding linear ulcers in the gastric fundus associated with the hiatal hernia   3. Large, 6-7 cm, hiatal hernia  Anemia probably blood loss  - follow CBC.; If stable in a.m. will discharge  Syncope  - probably from #1 reason.  Hypertension  - Continue  PRN IV hydralazine for systolic blood pressure more than 160.  -Hold lisinopril.  Hx BPH.  Alcohol abuse; - place on CIWA

## 2013-05-23 NOTE — ED Notes (Signed)
CBG 81.  

## 2013-05-23 NOTE — ED Provider Notes (Addendum)
CSN: [id number]     Arrival date & time 05/23/13  0235 History   First MD Initiated Contact with Patient 05/23/13 0448     Chief Complaint  Patient presents with  . Loss of Consciousness  . Emesis     (Consider location/radiation/quality/duration/timing/severity/associated sxs/prior Treatment) Patient is a 68 y.o. male presenting with syncope and vomiting. The history is provided by the patient.  Loss of Consciousness Associated symptoms: vomiting   Emesis He states that he was at work when he got lightheaded and passed out twice. Following that, he developed nausea and diaphoresis and vomited some in the habit of coffee-ground material in it. He states that he indeed and oriented and he thinks and what it might have been. He states he feels fine now. He denies chest pain, heaviness, tightness, pressure. He denies palpitations. There is report that EMS noted that he was pale and diaphoretic at their initial evaluation there is a report that he was orthostatic but I do not have exact with a static vital signs available to me. He admits to taking aspirin daily and frequently taking Excedrin. He denies any recent abdominal pain and he denies dark stools.  Past Medical History  Diagnosis Date  . BPH (benign prostatic hyperplasia)   . Frequency of urination   . Nocturia   . Allergic rhinitis   . Iron deficiency   . Hypertension    Past Surgical History  Procedure Laterality Date  . Left ankle debridement and fusion  2010  . Transurethral resection of prostate  01/31/2011    Procedure: TRANSURETHRAL RESECTION OF THE PROSTATE WITH GYRUS INSTRUMENTS;  Surgeon: Bernestine Amass, MD;  Location: [facility];  Service: Urology;  Laterality: N/A;  GYRUS OWER   Family History  Problem Relation Age of Onset  . Diabetes Mother    History  Substance Use Topics  . Smoking status: Former Smoker    Types: Cigarettes    Quit date: 01/24/1977  . Smokeless tobacco: Never Used  .  Alcohol Use: 8.4 oz/week    14 Cans of beer per week    Review of Systems  Cardiovascular: Positive for syncope.  Gastrointestinal: Positive for vomiting.  All other systems reviewed and are negative.     Allergies  Review of patient's allergies indicates no known allergies.  Home Medications   Prior to Admission medications   Medication Sig Start Date End Date Taking? Authorizing Provider  IRON PO Take 1 tablet by mouth daily.   Yes Historical Provider, MD  lisinopril-hydrochlorothiazide (PRINZIDE,ZESTORETIC) 20-25 MG per tablet Take 1 tablet by mouth daily. 10/17/12  Yes Nishant Dhungel, MD  loratadine-pseudoephedrine (CLARITIN-D 12-HOUR) 5-120 MG per tablet Take 1 tablet by mouth as needed.   Yes Historical Provider, MD  Multiple Vitamin (MULTIVITAMIN) tablet Take 1 tablet by mouth daily.   Yes Historical Provider, MD  tamsulosin (FLOMAX) 0.4 MG CAPS capsule Take 1 capsule (0.4 mg total) by mouth daily. 10/17/12  Yes Nishant Dhungel, MD   BP 101/67  Pulse 71  Temp(Src) 97.9 F (36.6 C) (Oral)  Resp 14  SpO2 99% Physical Exam  Nursing note and vitals reviewed.  68 year old male, resting comfortably and in no acute distress. Vital signs are normal. Oxygen saturation is 99%, which is normal. Head is normocephalic and atraumatic. PERRLA, EOMI. Oropharynx is clear. Conjunctivae are pink. Neck is nontender and supple without adenopathy or JVD. Back is nontender and there is no CVA tenderness. Lungs are clear without rales, wheezes,  or rhonchi. Chest is nontender. Heart has regular rate and rhythm without murmur. Abdomen is soft, flat, nontender without masses or hepatosplenomegaly and peristalsis is normoactive. Rectal: Normal sphincter tone, stool normal color but Hemoccult positive. Extremities have no cyanosis or edema, full range of motion is present. Skin is warm and dry without rash. Neurologic: Mental status is normal, cranial nerves are intact, there are no motor or  sensory deficits.  ED Course  Procedures (including critical care time) Labs Review Results for orders placed during the hospital encounter of 05/23/13  CBC WITH DIFFERENTIAL      Result Value Ref Range   WBC 12.5 (*) 4.0 - 10.5 K/uL   RBC 4.11 (*) 4.22 - 5.81 MIL/uL   Hemoglobin 10.6 (*) 13.0 - 17.0 g/dL   HCT 32.5 (*) 39.0 - 52.0 %   MCV 79.1  78.0 - 100.0 fL   MCH 25.8 (*) 26.0 - 34.0 pg   MCHC 32.6  30.0 - 36.0 g/dL   RDW 15.8 (*) 11.5 - 15.5 %   Platelets 254  150 - 400 K/uL   Neutrophils Relative % 75  43 - 77 %   Neutro Abs 9.2 (*) 1.7 - 7.7 K/uL   Lymphocytes Relative 14  12 - 46 %   Lymphs Abs 1.8  0.7 - 4.0 K/uL   Monocytes Relative 6  3 - 12 %   Monocytes Absolute 0.8  0.1 - 1.0 K/uL   Eosinophils Relative 5  0 - 5 %   Eosinophils Absolute 0.6  0.0 - 0.7 K/uL   Basophils Relative 0  0 - 1 %   Basophils Absolute 0.0  0.0 - 0.1 K/uL  COMPREHENSIVE METABOLIC PANEL      Result Value Ref Range   Sodium 141  137 - 147 mEq/L   Potassium 4.3  3.7 - 5.3 mEq/L   Chloride 106  96 - 112 mEq/L   CO2 22  19 - 32 mEq/L   Glucose, Bld 114 (*) 70 - 99 mg/dL   BUN 35 (*) 6 - 23 mg/dL   Creatinine, Ser 1.32  0.50 - 1.35 mg/dL   Calcium 8.9  8.4 - 10.5 mg/dL   Total Protein 7.1  6.0 - 8.3 g/dL   Albumin 3.6  3.5 - 5.2 g/dL   AST 20  0 - 37 U/L   ALT 14  0 - 53 U/L   Alkaline Phosphatase 93  39 - 117 U/L   Total Bilirubin <0.2 (*) 0.3 - 1.2 mg/dL   GFR calc non Af Amer 54 (*) >90 mL/min   GFR calc Af Amer 63 (*) >90 mL/min  LIPASE, BLOOD      Result Value Ref Range   Lipase 63 (*) 11 - 59 U/L  I-STAT TROPOININ, ED      Result Value Ref Range   Troponin i, poc 0.00  0.00 - 0.08 ng/mL   Comment 3           CBG MONITORING, ED      Result Value Ref Range   Glucose-Capillary 115 (*) 70 - 99 mg/dL   Comment 1 Notify RN     Comment 2 Documented in Chart    POC OCCULT BLOOD, ED      Result Value Ref Range   Fecal Occult Bld POSITIVE (*) NEGATIVE  I-STAT CG4 LACTIC ACID, ED       Result Value Ref Range   Lactic Acid, Venous 0.60  0.5 - 2.2 mmol/L  SAMPLE TO BLOOD BANK      Result Value Ref Range   Blood Bank Specimen SAMPLE AVAILABLE FOR TESTING     Sample Expiration 05/24/2013    TYPE AND SCREEN      Result Value Ref Range   ABO/RH(D) B POS     Antibody Screen PENDING     Sample Expiration 05/26/2013     EKG Interpretation   Date/Time:  Thursday May 23 2013 03:06:10 EDT Ventricular Rate:  71 PR Interval:  213 QRS Duration: 89 QT Interval:  392 QTC Calculation: 426 R Axis:   -31 Text Interpretation:  Sinus rhythm Borderline prolonged PR interval  Probable left atrial enlargement Left axis deviation When compared with  ECG of 01/31/2011, No significant change was found Confirmed by [facility]  MD,  Jarron Curley ([id number]) on 05/23/2013 5:07:27 AM      MDM   Final diagnoses:  Upper gastrointestinal bleed  Syncopal episodes  Elevated lipase    Syncope with evidence of GI bleed. Hemoglobin has dropped significantly from last value in July of 2014. BUN has gone up significantly consistent with upper GI bleed. Lipase is borderline elevated and probably not clinically significant. He will clearly need to be admitted. Likely source is peptic ulcer disease or gastritis from aspirin use.  Lactic acid level is come back normal. Lipase is slightly elevated and probably not of any clinical significance. He is started on pantoprazole drip. Case is discussed with Dr. Hal Hope of [facility] who agrees to admit the patient.    Delora Fuel, MD 67/59/16 [id number]  Delora Fuel, MD 65/99/35 [id number]

## 2013-05-23 NOTE — ED Notes (Signed)
Ordered diet tray 

## 2013-05-23 NOTE — H&P (Signed)
[facility] History and Physical  Thomas Mcclain YQI:[id number] DOB: 04-28-1945 DOA: 05/23/2013  Referring physician: ER physician. PCP: Angelica Chessman, MD   Chief Complaint: Loss of consciousness.  HPI: Thomas Mcclain is a 68 y.o. male with history of hypertension and BPH had an episode of loss of consciousness at his workplace last night. Patient states that he suddenly became diaphoretic and was consciousness. His colleagues made him lie down and he regained his consciousness and when he stood up he had another episode. He did not have any tongue bite or incontinence of urine. Denies any chest pain or shortness of breath. No focal deficits. After the second episode patient throughout twice and it was bloody. EMS was called and as per the ER physician patient was hypotensive at the site. When patient came to the ER his blood pressure was in the low normal around 90 systolic and improved with 1 L normal saline bolus. Blood work showed hemoglobin of 10 and his previous one was around 14-15 last year. Stool for occult blood is positive. Patient takes aspirin and Excedrin. Denies taking any Motrin or Advil. Has not noticed any blood in his stools or black stools. Denies any abdominal pain or diarrhea. Patient states she has had a colonoscopy a few years ago(does not remember the exact date) which has per the patient was normal. Patient has been started on Protonix infusion and will be admitted GI bleed.   Review of Systems: As presented in the history of presenting illness, rest negative.  Past Medical History  Diagnosis Date  . BPH (benign prostatic hyperplasia)   . Frequency of urination   . Nocturia   . Allergic rhinitis   . Iron deficiency   . Hypertension    Past Surgical History  Procedure Laterality Date  . Left ankle debridement and fusion  2010  . Transurethral resection of prostate  01/31/2011    Procedure: TRANSURETHRAL RESECTION OF THE PROSTATE WITH GYRUS INSTRUMENTS;   Surgeon: Bernestine Amass, MD;  Location: [facility];  Service: Urology;  Laterality: N/A;  GYRUS OWER   Social History:  reports that he quit smoking about 36 years ago. His smoking use included Cigarettes. He smoked 0.00 packs per day. He has never used smokeless tobacco. He reports that he drinks about 8.4 ounces of alcohol per week. He reports that he does not use illicit drugs. Where does patient live home. Can patient participate in ADLs? Yes.  No Known Allergies  Family History:  Family History  Problem Relation Age of Onset  . Diabetes Mother       Prior to Admission medications   Medication Sig Start Date End Date Taking? Authorizing Provider  IRON PO Take 1 tablet by mouth daily.   Yes Historical Provider, MD  lisinopril-hydrochlorothiazide (PRINZIDE,ZESTORETIC) 20-25 MG per tablet Take 1 tablet by mouth daily. 10/17/12  Yes Nishant Dhungel, MD  loratadine-pseudoephedrine (CLARITIN-D 12-HOUR) 5-120 MG per tablet Take 1 tablet by mouth as needed.   Yes Historical Provider, MD  Multiple Vitamin (MULTIVITAMIN) tablet Take 1 tablet by mouth daily.   Yes Historical Provider, MD  tamsulosin (FLOMAX) 0.4 MG CAPS capsule Take 1 capsule (0.4 mg total) by mouth daily. 10/17/12  Yes Louellen Molder, MD    Physical Exam: Filed Vitals:   05/23/13 0430 05/23/13 0500 05/23/13 0530 05/23/13 0600  BP: 104/76 118/68 102/67 105/72  Pulse: 65 89 78 68  Temp:      TempSrc:      Resp: 17 24  14 16  SpO2: 98% 100% 100% 99%     General:  Well-developed and nourished.  Eyes: Anicteric no pallor.  ENT: No discharge from ears eyes nose mouth.  Neck: No mass felt.  Cardiovascular: S1-S2 heard.  Respiratory: No rhonchi or crepitations.  Abdomen: Soft nontender bowel sounds present. No guarding or rigidity.  Skin: No rash.  Musculoskeletal: No edema.  Psychiatric: Appears normal.  Neurologic:  Alert awake oriented to time place and person. Moves all  extremities.  Labs on Admission:  Basic Metabolic Panel:  Recent Labs Lab 05/23/13 0320  NA 141  K 4.3  CL 106  CO2 22  GLUCOSE 114*  BUN 35*  CREATININE 1.32  CALCIUM 8.9   Liver Function Tests:  Recent Labs Lab 05/23/13 0320  AST 20  ALT 14  ALKPHOS 93  BILITOT <0.2*  PROT 7.1  ALBUMIN 3.6    Recent Labs Lab 05/23/13 0320  LIPASE 63*   No results found for this basename: AMMONIA,  in the last 168 hours CBC:  Recent Labs Lab 05/23/13 0320  WBC 12.5*  NEUTROABS 9.2*  HGB 10.6*  HCT 32.5*  MCV 79.1  PLT 254   Cardiac Enzymes: No results found for this basename: CKTOTAL, CKMB, CKMBINDEX, TROPONINI,  in the last 168 hours  BNP (last 3 results) No results found for this basename: PROBNP,  in the last 8760 hours CBG:  Recent Labs Lab 05/23/13 0311  GLUCAP 115*    Radiological Exams on Admission: No results found.  EKG: Independently reviewed. Normal sinus rhythm with left axis deviation and poor R-wave progression.  Assessment/Plan Principal Problem:   Upper gastrointestinal bleed Active Problems:   Essential hypertension, benign   Anemia, iron deficiency   GI bleed   1. GI bleed most likely upper GI - patient has been kept n.p.o. Protonix infusion. Closely follow CBC. Continue gentle hydration. Since patient has had at least 4-5 g of hemoglobin drop and was initially hypotensive we will monitor patient in step down. Consult GI. 2. Anemia probably blood loss - follow CBC. 3. Syncope - probably from #1 reason. 4. Hypertension - when necessary IV hydralazine for systolic blood pressure more than 160. Hold lisinopril. 5. History of BPH.  Patient states that he drinks around one to 2 can of beer 2-3 times a week.    Code Status: Full code.  Family Communication: None.  Disposition Plan: Admit to inpatient.    [facility] Pager [phone].  If 7PM-7AM, please contact night-coverage [email or web address] Password  [facility] 05/23/2013, 6:44 AM

## 2013-05-23 NOTE — ED Notes (Signed)
Patient returned to room. 

## 2013-05-23 NOTE — Interval H&P Note (Signed)
History and Physical Interval Note:  05/23/2013 12:55 PM  Thomas Mcclain  has presented today for surgery, with the diagnosis of GI Bleed  The various methods of treatment have been discussed with the patient and family. After consideration of risks, benefits and other options for treatment, the patient has consented to  Procedure(s): ESOPHAGOGASTRODUODENOSCOPY (EGD) (N/A) as a surgical intervention .  The patient's history has been reviewed, patient examined, no change in status, stable for surgery.  I have reviewed the patient's chart and labs.  Questions were answered to the patient's satisfaction.     Ladene Artist MD

## 2013-05-23 NOTE — ED Notes (Signed)
Patient with syncopal episode x2.  Patient is pale, diaphoretic upon EMS arrival.  Patient does have coffee ground emesis.  Patient received 41ml NS bolus by EMS.  Patient is CAOx3.  Patient is orthostatic by EMS.

## 2013-05-23 NOTE — ED Notes (Signed)
Dr. Sherral Hammers to replace admission orders

## 2013-05-23 NOTE — H&P (View-Only) (Signed)
Referring Provider: [facility] hospitalist And Primary Care Physician:  Angelica Chessman, MD Primary Gastroenterologist:  None/unassigned and  Reason for Consultation: GI Bleed  HPI: Thomas Mcclain is a 68 y.o. male who was admitted through the emergency room last night after a syncopal episode at work. Apparently this was associated with diaphoresis and hematemesis of dark coffee ground material. He had not had any melena or hematochezia. He was hypotensive on arrival to the emergency room with systolic blood pressure around 90, he has responded to IV fluids. Initial hemoglobin 10.6 and repeat this morning 9.4, BUN 29 creatinine 1.19. Patient reports prior colonoscopy done in [place] about 6 or 7 years ago which he reports as negative, he has not had prior EGD.  Pt states he was feeling fine yesterday prior  to this episode with no Gi sxs. He currently denies any abdominal or chest pain, no dysphagia, no further emesis, no stools. He has been hemodynamically stable.  He does take 2 regular ASA every day for aches and pains, and also generally takes 2 exedrin daily  Past Medical History  Diagnosis Date  . BPH (benign prostatic hyperplasia)   . Frequency of urination   . Nocturia   . Allergic rhinitis   . Iron deficiency   . Hypertension     Past Surgical History  Procedure Laterality Date  . Left ankle debridement and fusion  2010  . Transurethral resection of prostate  01/31/2011    Procedure: TRANSURETHRAL RESECTION OF THE PROSTATE WITH GYRUS INSTRUMENTS;  Surgeon: Bernestine Amass, MD;  Location: [facility];  Service: Urology;  Laterality: N/A;  GYRUS OWER    Prior to Admission medications   Medication Sig Start Date End Date Taking? Authorizing Provider  IRON PO Take 1 tablet by mouth daily.   Yes Historical Provider, MD  lisinopril-hydrochlorothiazide (PRINZIDE,ZESTORETIC) 20-25 MG per tablet Take 1 tablet by mouth daily. 10/17/12  Yes Nishant Dhungel, MD   loratadine-pseudoephedrine (CLARITIN-D 12-HOUR) 5-120 MG per tablet Take 1 tablet by mouth as needed.   Yes Historical Provider, MD  Multiple Vitamin (MULTIVITAMIN) tablet Take 1 tablet by mouth daily.   Yes Historical Provider, MD  tamsulosin (FLOMAX) 0.4 MG CAPS capsule Take 1 capsule (0.4 mg total) by mouth daily. 10/17/12  Yes Nishant Dhungel, MD    Current Facility-Administered Medications  Medication Dose Route Frequency Provider Last Rate Last Dose  . 0.9 %  sodium chloride infusion   Intravenous Continuous Rise Patience, MD 125 mL/hr at 05/23/13 0730    . acetaminophen (TYLENOL) tablet 650 mg  650 mg Oral Q6H PRN Rise Patience, MD       Or  . acetaminophen (TYLENOL) suppository 650 mg  650 mg Rectal Q6H PRN Rise Patience, MD      . hydrALAZINE (APRESOLINE) injection 10 mg  10 mg Intravenous Q4H PRN Rise Patience, MD      . ondansetron [facility]) tablet 4 mg  4 mg Oral Q6H PRN Rise Patience, MD       Or  . ondansetron [facility]) injection 4 mg  4 mg Intravenous Q6H PRN Rise Patience, MD      . pantoprazole (PROTONIX) 80 mg in sodium chloride 0.9 % 250 mL infusion  8 mg/hr Intravenous Continuous Delora Fuel, MD 25 mL/hr at 05/23/13 0809 8 mg/hr at 05/23/13 0809  . [START ON 05/26/2013] pantoprazole (PROTONIX) injection 40 mg  40 mg Intravenous N46E Delora Fuel, MD       Current  Outpatient Prescriptions  Medication Sig Dispense Refill  . IRON PO Take 1 tablet by mouth daily.      . lisinopril-hydrochlorothiazide (PRINZIDE,ZESTORETIC) 20-25 MG per tablet Take 1 tablet by mouth daily.  30 tablet  5  . loratadine-pseudoephedrine (CLARITIN-D 12-HOUR) 5-120 MG per tablet Take 1 tablet by mouth as needed.      . Multiple Vitamin (MULTIVITAMIN) tablet Take 1 tablet by mouth daily.      . tamsulosin (FLOMAX) 0.4 MG CAPS capsule Take 1 capsule (0.4 mg total) by mouth daily.  30 capsule  5    Allergies as of 05/23/2013  . (No Known Allergies)    Family  History  Problem Relation Age of Onset  . Diabetes Mother     History   Social History  . Marital Status: Married    Spouse Name: N/A    Number of Children: N/A  . Years of Education: N/A   Occupational History  . Not on file.   Social History Main Topics  . Smoking status: Former Smoker    Types: Cigarettes    Quit date: 01/24/1977  . Smokeless tobacco: Never Used  . Alcohol Use: 8.4 oz/week    14 Cans of beer per week  . Drug Use: No  . Sexual Activity: Not on file   Other Topics Concern  . Not on file   Social History Narrative  . No narrative on file    Review of Systems: 10 and point ROS negative except as in HPI.  Physical Exam: Vital signs in last 24 hours: Temp:  [97.9 F (36.6 C)] 97.9 F (36.6 C) (05/21 0247) Pulse Rate:  [65-89] 70 (05/21 1017) Resp:  [11-24] 22 (05/21 1017) BP: (99-123)/(58-95) 102/63 mmHg (05/21 1017) SpO2:  [98 %-100 %] 99 % (05/21 1017)   General:   Alert,  Well-developed, WM, well-nourished, pleasant and cooperative in NAD Head:  Normocephalic and atraumatic. Eyes:  Sclera clear, no icterus.   Conjunctiva pink. Ears:  Normal auditory acuity. Nose:  No deformity, discharge,  or lesions. Mouth:  No deformity or lesions.   Neck:  Supple; no masses or thyromegaly. Lungs:  Clear throughout to auscultation.   No wheezes, crackles, or rhonchi. Heart:  Regular rate and rhythm; no murmurs, clicks, rubs,  or gallops. Abdomen:  Soft,nontender, BS active,nonpalp mass or hsm.   Rectal:  Deferred  Msk:  Symmetrical without gross deformities. . Pulses:  Normal pulses noted. Extremities:  Without clubbing or edema. Neurologic:  Alert and  oriented x4;  grossly normal neurologically. Skin:  Intact without significant lesions or rashes.. Psych:  Alert and cooperative. Normal mood and affect.  Intake/Output from previous day:   Intake/Output this shift:    Lab Results:  Recent Labs  05/23/13 0320 05/23/13 0812  WBC 12.5* 9.4   HGB 10.6* 9.4*  HCT 32.5* 28.4*  PLT 254 221   BMET  Recent Labs  05/23/13 0320 05/23/13 0812  NA 141 142  K 4.3 4.5  CL 106 108  CO2 22 24  GLUCOSE 114* 103*  BUN 35* 29*  CREATININE 1.32 1.19  CALCIUM 8.9 8.4   LFT  Recent Labs  05/23/13 0812  PROT 6.2  ALBUMIN 3.2*  AST 17  ALT 11  ALKPHOS 83  BILITOT 0.2*    IMPRESSION:  #1  68 yo male with acute Upper GI bleed with syncope and hypotension- stable  Suspect ASA induced PUD #2 anemia- secondary to acute bleed #3 hx HTN  PLAN:  #  1 NPO #2 EGD this afternoon with Dr Maddyx Vallie- discussed with pt in detail and he is agreeable to proceed #3 IV Protonix infusion as doing #4 serial hgbs and transfuse for hgb less than 8 #5 stop all ASA use    Amy S Esterwood  05/23/2013, 10:40 AM      Attending physician's note   I have taken a history, examined the patient and reviewed the chart. I agree with the Advanced Practitioner's note, impression and recommendations. Acute UGI bleed with syncope and hypotension. Suspected ulcer bleed. IV PPI infusion. Trend Hb/Hct. Hold ASA. EGD today. The risks, benefits, and alternatives to endoscopy with possible biopsy and possible dilation were discussed with the patient and they consent to proceed.    Yaman Grauberger T Jazlene Bares, MD FACG  

## 2013-05-23 NOTE — ED Notes (Signed)
Consent obtained, placed at the bedside.  Endo nurse to pick up patient and transfer to procedure area.

## 2013-05-23 NOTE — ED Notes (Signed)
MD at bedside. 

## 2013-05-23 NOTE — Progress Notes (Signed)
NURSING PROGRESS NOTE  Thomas Mcclain [id number] Admission Data: 05/23/2013 6:34 PM Attending Provider: Allie Bossier, MD [id number], Gabrielle Dare, MD Code Status: full  Thomas Mcclain is a 68 y.o. male patient admitted from ED:  -No acute distress noted.  -No complaints of shortness of breath.  -No complaints of chest pain.    Blood pressure 122/79, pulse 81, temperature 97.6 F (36.4 C), temperature source Oral, resp. rate 18, height 6' (1.829 m), weight 88.905 kg (196 lb), SpO2 99.00%.   IV Fluids:  IV in place, occlusive dsg intact without redness, IV cath antecubital left, condition patent and no redness normal saline @ 125 and LFA SL.   Allergies:  Review of patient's allergies indicates no known allergies.  Past Medical History:   has a past medical history of BPH (benign prostatic hyperplasia); Frequency of urination; Nocturia; Allergic rhinitis; Iron deficiency; and Hypertension.  Past Surgical History:   has past surgical history that includes LEFT ANKLE DEBRIDEMENT AND FUSION (2010) and Transurethral resection of prostate (01/31/2011).  Social History:   reports that he quit smoking about 36 years ago. His smoking use included Cigarettes. He smoked 0.00 packs per day. He has never used smokeless tobacco. He reports that he drinks about 8.4 ounces of alcohol per week. He reports that he does not use illicit drugs.  Skin:   Patient/Family orientated to room. Information packet given to patient/family. Admission inpatient armband information verified with patient/family to include name and date of birth and placed on patient arm. Side rails up x 2, fall assessment and education completed with patient/family. Patient/family able to verbalize understanding of risk associated with falls and verbalized understanding to call for assistance before getting out of bed. Call light within reach. Patient/family able to voice and demonstrate understanding of unit orientation instructions.     Will continue to evaluate and treat per MD orders.

## 2013-05-23 NOTE — Consult Note (Signed)
Referring Provider: [facility] hospitalist And Primary Care Physician:  Angelica Chessman, MD Primary Gastroenterologist:  None/unassigned and  Reason for Consultation: GI Bleed  HPI: Thomas Mcclain is a 68 y.o. male who was admitted through the emergency room last night after a syncopal episode at work. Apparently this was associated with diaphoresis and hematemesis of dark coffee ground material. He had not had any melena or hematochezia. He was hypotensive on arrival to the emergency room with systolic blood pressure around 90, he has responded to IV fluids. Initial hemoglobin 10.6 and repeat this morning 9.4, BUN 29 creatinine 1.19. Patient reports prior colonoscopy done in [place] about 6 or 7 years ago which he reports as negative, he has not had prior EGD.  Pt states he was feeling fine yesterday prior  to this episode with no Gi sxs. He currently denies any abdominal or chest pain, no dysphagia, no further emesis, no stools. He has been hemodynamically stable.  He does take 2 regular ASA every day for aches and pains, and also generally takes 2 exedrin daily  Past Medical History  Diagnosis Date  . BPH (benign prostatic hyperplasia)   . Frequency of urination   . Nocturia   . Allergic rhinitis   . Iron deficiency   . Hypertension     Past Surgical History  Procedure Laterality Date  . Left ankle debridement and fusion  2010  . Transurethral resection of prostate  01/31/2011    Procedure: TRANSURETHRAL RESECTION OF THE PROSTATE WITH GYRUS INSTRUMENTS;  Surgeon: Bernestine Amass, MD;  Location: [facility];  Service: Urology;  Laterality: N/A;  GYRUS OWER    Prior to Admission medications   Medication Sig Start Date End Date Taking? Authorizing Provider  IRON PO Take 1 tablet by mouth daily.   Yes Historical Provider, MD  lisinopril-hydrochlorothiazide (PRINZIDE,ZESTORETIC) 20-25 MG per tablet Take 1 tablet by mouth daily. 10/17/12  Yes Nishant Dhungel, MD   loratadine-pseudoephedrine (CLARITIN-D 12-HOUR) 5-120 MG per tablet Take 1 tablet by mouth as needed.   Yes Historical Provider, MD  Multiple Vitamin (MULTIVITAMIN) tablet Take 1 tablet by mouth daily.   Yes Historical Provider, MD  tamsulosin (FLOMAX) 0.4 MG CAPS capsule Take 1 capsule (0.4 mg total) by mouth daily. 10/17/12  Yes Nishant Dhungel, MD    Current Facility-Administered Medications  Medication Dose Route Frequency Provider Last Rate Last Dose  . 0.9 %  sodium chloride infusion   Intravenous Continuous Rise Patience, MD 125 mL/hr at 05/23/13 0730    . acetaminophen (TYLENOL) tablet 650 mg  650 mg Oral Q6H PRN Rise Patience, MD       Or  . acetaminophen (TYLENOL) suppository 650 mg  650 mg Rectal Q6H PRN Rise Patience, MD      . hydrALAZINE (APRESOLINE) injection 10 mg  10 mg Intravenous Q4H PRN Rise Patience, MD      . ondansetron [facility]) tablet 4 mg  4 mg Oral Q6H PRN Rise Patience, MD       Or  . ondansetron [facility]) injection 4 mg  4 mg Intravenous Q6H PRN Rise Patience, MD      . pantoprazole (PROTONIX) 80 mg in sodium chloride 0.9 % 250 mL infusion  8 mg/hr Intravenous Continuous Delora Fuel, MD 25 mL/hr at 05/23/13 0809 8 mg/hr at 05/23/13 0809  . [START ON 05/26/2013] pantoprazole (PROTONIX) injection 40 mg  40 mg Intravenous N46E Delora Fuel, MD       Current  Outpatient Prescriptions  Medication Sig Dispense Refill  . IRON PO Take 1 tablet by mouth daily.      Marland Kitchen lisinopril-hydrochlorothiazide (PRINZIDE,ZESTORETIC) 20-25 MG per tablet Take 1 tablet by mouth daily.  30 tablet  5  . loratadine-pseudoephedrine (CLARITIN-D 12-HOUR) 5-120 MG per tablet Take 1 tablet by mouth as needed.      . Multiple Vitamin (MULTIVITAMIN) tablet Take 1 tablet by mouth daily.      . tamsulosin (FLOMAX) 0.4 MG CAPS capsule Take 1 capsule (0.4 mg total) by mouth daily.  30 capsule  5    Allergies as of 05/23/2013  . (No Known Allergies)    Family  History  Problem Relation Age of Onset  . Diabetes Mother     History   Social History  . Marital Status: Married    Spouse Name: N/A    Number of Children: N/A  . Years of Education: N/A   Occupational History  . Not on file.   Social History Main Topics  . Smoking status: Former Smoker    Types: Cigarettes    Quit date: 01/24/1977  . Smokeless tobacco: Never Used  . Alcohol Use: 8.4 oz/week    14 Cans of beer per week  . Drug Use: No  . Sexual Activity: Not on file   Other Topics Concern  . Not on file   Social History Narrative  . No narrative on file    Review of Systems: 10 and point ROS negative except as in HPI.  Physical Exam: Vital signs in last 24 hours: Temp:  [97.9 F (36.6 C)] 97.9 F (36.6 C) (05/21 0247) Pulse Rate:  [65-89] 70 (05/21 1017) Resp:  [11-24] 22 (05/21 1017) BP: (99-123)/(58-95) 102/63 mmHg (05/21 1017) SpO2:  [98 %-100 %] 99 % (05/21 1017)   General:   Alert,  Well-developed, WM, well-nourished, pleasant and cooperative in NAD Head:  Normocephalic and atraumatic. Eyes:  Sclera clear, no icterus.   Conjunctiva pink. Ears:  Normal auditory acuity. Nose:  No deformity, discharge,  or lesions. Mouth:  No deformity or lesions.   Neck:  Supple; no masses or thyromegaly. Lungs:  Clear throughout to auscultation.   No wheezes, crackles, or rhonchi. Heart:  Regular rate and rhythm; no murmurs, clicks, rubs,  or gallops. Abdomen:  Soft,nontender, BS active,nonpalp mass or hsm.   Rectal:  Deferred  Msk:  Symmetrical without gross deformities. . Pulses:  Normal pulses noted. Extremities:  Without clubbing or edema. Neurologic:  Alert and  oriented x4;  grossly normal neurologically. Skin:  Intact without significant lesions or rashes.. Psych:  Alert and cooperative. Normal mood and affect.  Intake/Output from previous day:   Intake/Output this shift:    Lab Results:  Recent Labs  05/23/13 0320 05/23/13 0812  WBC 12.5* 9.4   HGB 10.6* 9.4*  HCT 32.5* 28.4*  PLT 254 221   BMET  Recent Labs  05/23/13 0320 05/23/13 0812  NA 141 142  K 4.3 4.5  CL 106 108  CO2 22 24  GLUCOSE 114* 103*  BUN 35* 29*  CREATININE 1.32 1.19  CALCIUM 8.9 8.4   LFT  Recent Labs  05/23/13 0812  PROT 6.2  ALBUMIN 3.2*  AST 17  ALT 11  ALKPHOS 83  BILITOT 0.2*    IMPRESSION:  #1  68 yo male with acute Upper GI bleed with syncope and hypotension- stable  Suspect ASA induced PUD #2 anemia- secondary to acute bleed #3 hx HTN  PLAN:  #  1 NPO #2 EGD this afternoon with Dr Fuller Plan- discussed with pt in detail and he is agreeable to proceed #3 IV Protonix infusion as doing #4 serial hgbs and transfuse for hgb less than 8 #5 stop all ASA use    Amy S Esterwood  05/23/2013, 10:40 AM      Attending physician's note   I have taken a history, examined the patient and reviewed the chart. I agree with the Advanced Practitioner's note, impression and recommendations. Acute UGI bleed with syncope and hypotension. Suspected ulcer bleed. IV PPI infusion. Trend Hb/Hct. Hold ASA. EGD today. The risks, benefits, and alternatives to endoscopy with possible biopsy and possible dilation were discussed with the patient and they consent to proceed.    Ladene Artist, MD Marval Regal

## 2013-05-24 ENCOUNTER — Encounter (HOSPITAL_COMMUNITY): Payer: Self-pay | Admitting: Gastroenterology

## 2013-05-24 DIAGNOSIS — K209 Esophagitis, unspecified without bleeding: Secondary | ICD-10-CM | POA: Diagnosis present

## 2013-05-24 DIAGNOSIS — K449 Diaphragmatic hernia without obstruction or gangrene: Secondary | ICD-10-CM | POA: Diagnosis present

## 2013-05-24 DIAGNOSIS — D509 Iron deficiency anemia, unspecified: Secondary | ICD-10-CM

## 2013-05-24 DIAGNOSIS — I10 Essential (primary) hypertension: Secondary | ICD-10-CM

## 2013-05-24 LAB — COMPREHENSIVE METABOLIC PANEL
ALBUMIN: 3.1 g/dL — AB (ref 3.5–5.2)
ALT: 11 U/L (ref 0–53)
AST: 17 U/L (ref 0–37)
Alkaline Phosphatase: 77 U/L (ref 39–117)
BUN: 15 mg/dL (ref 6–23)
CALCIUM: 8.4 mg/dL (ref 8.4–10.5)
CO2: 24 mEq/L (ref 19–32)
Chloride: 109 mEq/L (ref 96–112)
Creatinine, Ser: 1.1 mg/dL (ref 0.50–1.35)
GFR calc non Af Amer: 68 mL/min — ABNORMAL LOW (ref >90)
GFR, EST AFRICAN AMERICAN: 78 mL/min — AB (ref >90)
Glucose, Bld: 94 mg/dL (ref 70–99)
Potassium: 4.4 mEq/L (ref 3.7–5.3)
Sodium: 141 mEq/L (ref 137–147)
TOTAL PROTEIN: 5.9 g/dL — AB (ref 6.0–8.3)
Total Bilirubin: 0.6 mg/dL (ref 0.3–1.2)

## 2013-05-24 LAB — CBC
HCT: 27.5 % — ABNORMAL LOW (ref 39.0–52.0)
Hemoglobin: 9 g/dL — ABNORMAL LOW (ref 13.0–17.0)
MCH: 25.8 pg — AB (ref 26.0–34.0)
MCHC: 32.7 g/dL (ref 30.0–36.0)
MCV: 78.8 fL (ref 78.0–100.0)
Platelets: 204 10*3/uL (ref 150–400)
RBC: 3.49 MIL/uL — ABNORMAL LOW (ref 4.22–5.81)
RDW: 15.9 % — ABNORMAL HIGH (ref 11.5–15.5)
WBC: 6.7 10*3/uL (ref 4.0–10.5)

## 2013-05-24 LAB — GLUCOSE, CAPILLARY
GLUCOSE-CAPILLARY: 72 mg/dL (ref 70–99)
GLUCOSE-CAPILLARY: 91 mg/dL (ref 70–99)
Glucose-Capillary: 84 mg/dL (ref 70–99)
Glucose-Capillary: 65 mg/dL — ABNORMAL LOW (ref 70–99)
Glucose-Capillary: 79 mg/dL (ref 70–99)

## 2013-05-24 MED ORDER — SUCRALFATE 1 GM/10ML PO SUSP: 1.0000 g | Freq: Three times a day (TID) | ORAL | Status: DC

## 2013-05-24 MED ORDER — SUCRALFATE 1 GM/10ML PO SUSP
1.0000 g | Freq: Three times a day (TID) | ORAL | Status: DC
  Administered 2013-05-24: 1 g via ORAL
  Filled 2013-05-24 (×2): qty 10

## 2013-05-24 MED ORDER — PANTOPRAZOLE SODIUM 40 MG PO TBEC: 40.0000 mg | DELAYED_RELEASE_TABLET | Freq: Two times a day (BID) | ORAL | Status: DC

## 2013-05-24 NOTE — Progress Notes (Signed)
Hypoglycemic Event  CBG: 65  Treatment: 15 GM carbohydrate snack  Symptoms: None  Follow-up CBG: Time:1851 CBG Result:75  Possible Reasons for Event: Inadequate meal intake  Comments/MD notified:none    Daire Okimoto C Nichole Keltner  Remember to initiate Hypoglycemia Order Set & complete

## 2013-05-24 NOTE — Progress Notes (Addendum)
Spoke with Precious Bard, flow manager who stated will get the attending to see pt.  Will continue to monitor.  Alphonzo Lemmings, RN

## 2013-05-24 NOTE — Progress Notes (Signed)
Pt. Still states haven't seen MD today and there's no note in chart.  Text paged Dr. Sherral Hammers who is listed as attending.  Dr. Sherral Hammers states he only see's pt. In ICU & stepdown.  Will call flow manager to see who will see pt. Today and continue to monitor.  Alphonzo Lemmings, RN

## 2013-05-24 NOTE — Progress Notes (Signed)
UR completed 

## 2013-05-24 NOTE — Discharge Summary (Signed)
Physician Discharge Summary  Gemini Beaumier BPZ:[id number] DOB: 1945-07-14 DOA: 05/23/2013  PCP: Angelica Chessman, MD  Admit date: 05/23/2013 Discharge date: 05/24/2013  Time spent: 40 minutes  Recommendations for Outpatient Follow-up:  LA Class B esophagitis -Discharge Protonix 40 mg BID   Multiple non-bleeding linear ulcers in the gastric fundus  -Discharge on sucralfate 1 gm QAC/QHS -Followup with Dr. Lucio Edward (GI) in 30 days; biopsy report will have returned.  Large, 6-7 cm, hiatal hernia -Followup with PCP. PCP to refer to surgeon for repair of hiatal hernia   Syncope  - probably from #1 reason.  -Followup with PCP  Hypertension  - Within AHA guidelines   -Hold lisinopril. At discharge patient to followup with PCP      Discharge Diagnoses:  Principal Problem:   Upper gastrointestinal bleed Active Problems:   Essential hypertension, benign   Anemia, iron deficiency   GI bleed   Acute posthemorrhagic anemia   Syncope   Discharge Condition: Stable  Diet recommendation:   Filed Weights   05/23/13 1229  Weight: 88.905 kg (196 lb)    History of present illness:  68 y.o. male with history of hypertension and BPH had an episode of loss of consciousness at his workplace last night. Patient states that he suddenly became diaphoretic and was consciousness. His colleagues made him lie down and he regained his consciousness and when he stood up he had another episode. He did not have any tongue bite or incontinence of urine. Denies any chest pain or shortness of breath. No focal deficits. After the second episode patient throughout twice and it was bloody. EMS was called and as per the ER physician patient was hypotensive at the site. When patient came to the ER his blood pressure was in the low normal around 90 systolic and improved with 1 L normal saline bolus. Blood work showed hemoglobin of 10 and his previous one was around 14-15 last year. Stool for occult blood  is positive. Patient takes aspirin and Excedrin. Denies taking any Motrin or Advil. Has not noticed any blood in his stools or black stools. Denies any abdominal pain or diarrhea. Patient states she has had a colonoscopy a few years ago(does not remember the exact date) which has per the patient was normal. Patient has been started on Protonix infusion and will be admitted GI bleed. Patient states that he drinks around one to 2 can of beer 2-3 times a week. 5/22 S/P endoscopy with esophagitis, hiatal hernia and ulcers; see the report below   Procedures: 5/21 endoscopy - LA Class B esophagitis  -Multiple non-bleeding linear ulcers in the gastric fundus associated with the hiatal hernia  -Large, 6-7 cm, hiatal hernia   Consultations: Dr. Lucio Edward (GI)    Antibiotics    Discharge Exam: Filed Vitals:   05/23/13 2238 05/24/13 0529 05/24/13 1200 05/24/13 1826  BP: 115/75  129/82 123/76  Pulse: 70  74 67  Temp: 98.2 F (36.8 C) 97.6 F (36.4 C) 98 F (36.7 C) 98.2 F (36.8 C)  TempSrc: Oral Oral Oral Oral  Resp: 18 18 16 16   Height:      Weight:      SpO2: 99% 98% 99% 99%    General: A./O. x4, NAD Cardiovascular: RRR, (-) M/R/G, normal S1-S2  Respiratory: CTA Bilat , (-) wheeze  Abdomen: Soft nontender, (+) bowel sounds present. No guarding or rigidity.  Discharge Instructions     Medication List    STOP taking these medications  lisinopril-hydrochlorothiazide 20-25 MG per tablet  Commonly known as:  PRINZIDE,ZESTORETIC      TAKE these medications       IRON PO  Take 1 tablet by mouth daily.     loratadine-pseudoephedrine 5-120 MG per tablet  Commonly known as:  CLARITIN-D 12-hour  Take 1 tablet by mouth as needed.     multivitamin tablet  Take 1 tablet by mouth daily.     pantoprazole 40 MG tablet  Commonly known as:  PROTONIX  Take 1 tablet (40 mg total) by mouth 2 (two) times daily.     sucralfate 1 GM/10ML suspension  Commonly known as:   CARAFATE  Take 10 mLs (1 g total) by mouth 4 (four) times daily -  with meals and at bedtime.     tamsulosin 0.4 MG Caps capsule  Commonly known as:  FLOMAX  Take 1 capsule (0.4 mg total) by mouth daily.       No Known Allergies Follow-up Information   Follow up with JEGEDE, OLUGBEMIGA, MD. Schedule an appointment as soon as possible for a visit in 1 week. [facility] followup; referral to general surgeon for hiatal hernia repair)    Specialty:  Internal Medicine   Contact information:   [place] [address] [phone]       Follow up with Norberto Sorenson T. Fuller Plan, MD. Schedule an appointment as soon as possible for a visit in 1 month. (Followup GI bleed)    Specialty:  Gastroenterology   Contact information:   [address] [address] [phone]        The results of significant diagnostics from this hospitalization (including imaging, microbiology, ancillary and laboratory) are listed below for reference.    Significant Diagnostic Studies: No results found.  Microbiology: No results found for this or any previous visit (from the past 240 hour(s)).   Labs: Basic Metabolic Panel:  Recent Labs Lab 05/23/13 0320 05/23/13 0812 05/24/13 0641  NA 141 142 141  K 4.3 4.5 4.4  CL 106 108 109  CO2 22 24 24   GLUCOSE 114* 103* 94  BUN 35* 29* 15  CREATININE 1.32 1.19 1.10  CALCIUM 8.9 8.4 8.4   Liver Function Tests:  Recent Labs Lab 05/23/13 0320 05/23/13 0812 05/24/13 0641  AST 20 17 17   ALT 14 11 11   ALKPHOS 93 83 77  BILITOT <0.2* 0.2* 0.6  PROT 7.1 6.2 5.9*  ALBUMIN 3.6 3.2* 3.1*    Recent Labs Lab 05/23/13 0320  LIPASE 63*   No results found for this basename: AMMONIA,  in the last 168 hours CBC:  Recent Labs Lab 05/23/13 0320 05/23/13 0812 05/23/13 1204 05/23/13 1500 05/23/13 1850 05/23/13 1950 05/24/13 0641  WBC 12.5* 9.4 8.2 8.5  --  7.6 6.7  NEUTROABS 9.2*  --   --   --   --   --   --   HGB 10.6* 9.4*  9.0* 9.6* 9.1* 9.1* 9.0*  HCT 32.5* 28.4* 27.7* 28.2*  --  28.0* 27.5*  MCV 79.1 78.5 78.2 77.3*  --  77.8* 78.8  PLT 254 221 206 219  --  225 204   Cardiac Enzymes:  Recent Labs Lab 05/23/13 0812  TROPONINI <0.30   BNP: BNP (last 3 results) No results found for this basename: PROBNP,  in the last 8760 hours CBG:  Recent Labs Lab 05/24/13 0020 05/24/13 0535 05/24/13 1202 05/24/13 1829 05/24/13 1851  GLUCAP 72 84 91 65* 79  Signed:  Dia Crawford, MD [facility] [phone] pager

## 2013-05-24 NOTE — Progress Notes (Signed)
While giving report to night RN, pt. Stated that MD still hadn't came in to see him.  Call flow manager office back and spoke with Precious Bard again who stated Dr. Sherral Hammers would see pt. Tonight.  Will let night RN know and informed pt.  Will continue to monitor.  Thomas Lemmings, RN

## 2013-05-25 NOTE — Progress Notes (Signed)
Eunice Blase discharged per MD order.  Discharge instructions reviewed and discussed with the patient, all questions and concerns answered. Copy of instructions and scripts given to patient.    Medication List    STOP taking these medications       lisinopril-hydrochlorothiazide 20-25 MG per tablet  Commonly known as:  PRINZIDE,ZESTORETIC      TAKE these medications       IRON PO  Take 1 tablet by mouth daily.     loratadine-pseudoephedrine 5-120 MG per tablet  Commonly known as:  CLARITIN-D 12-hour  Take 1 tablet by mouth as needed.     multivitamin tablet  Take 1 tablet by mouth daily.     pantoprazole 40 MG tablet  Commonly known as:  PROTONIX  Take 1 tablet (40 mg total) by mouth 2 (two) times daily.     sucralfate 1 GM/10ML suspension  Commonly known as:  CARAFATE  Take 10 mLs (1 g total) by mouth 4 (four) times daily -  with meals and at bedtime.     tamsulosin 0.4 MG Caps capsule  Commonly known as:  FLOMAX  Take 1 capsule (0.4 mg total) by mouth daily.        Patients skin is clean, dry and intact, no evidence of skin break down. IV site discontinued and catheter remains intact. Site without signs and symptoms of complications. Dressing and pressure applied.  Patient escorted to car by NT in a wheelchair,  no distress noted upon discharge.  Josephine Cables Luara Faye 05/25/2013 1:10 AM

## 2013-05-28 NOTE — ED Provider Notes (Signed)
May 28, 2013   Patient: Thomas Mcclain  Date of Birth: Jul 14, 1945  Date of Visit: 05/23/2013    To Whom It May Concern:  It is my medical opinion that Eunice Blase, may return to work without restrictions.  If you have any questions or concerns, please don't hesitate to call.  Sincerely,    Allie Bossier, MD [phone]

## 2013-10-22 ENCOUNTER — Encounter: Payer: Self-pay | Admitting: Sports Medicine

## 2013-10-22 ENCOUNTER — Ambulatory Visit (INDEPENDENT_AMBULATORY_CARE_PROVIDER_SITE_OTHER): Payer: Medicare Other | Admitting: Sports Medicine

## 2013-10-22 VITALS — BP 120/80 | HR 91 | Temp 98.4°F | Resp 16 | Ht 71.0 in | Wt 193.0 lb

## 2013-10-22 DIAGNOSIS — J069 Acute upper respiratory infection, unspecified: Secondary | ICD-10-CM

## 2013-10-22 MED ORDER — IPRATROPIUM BROMIDE 0.03 % NA SOLN: 2.0000 | Freq: Two times a day (BID) | NASAL | Status: DC

## 2013-10-22 MED ORDER — BENZONATATE 100 MG PO CAPS: 100.0000 mg | ORAL_CAPSULE | Freq: Two times a day (BID) | ORAL | Status: DC | PRN

## 2013-10-22 NOTE — Progress Notes (Signed)
   Subjective:    Patient ID: Thomas Mcclain, male    DOB: 12-01-1945, 68 y.o.   MRN: [id number]  HPI Thomas Mcclain is a 68yo male who presents with 3 days of cough and congestion.  He was recently in [place] last week. When he returned, he noticed increasing nasal congestion, ear fullness bilaterally. He has had a productive cough of yellowish clear fluid. He denies any fevers or chills. He notes some fatigue. He denies any problems with appetite. He denies any wheezing, shortness of breath, chest pain, or swelling. He has tried some mucinex and thera-flu with little relief. No aggravating factors.   Tobacco: Quit in 1979, but had been smoking 2 ppd since he was a teenager. PMHx: Complicated by HTN- cannot take pseudoephedrine  Past medical history, social history, medications, and allergies were reviewed and are up to date in the chart.  Review of Systems 11 point review of systems was performed and was otherwise negative unless noted in the history of present illness.     Objective:   Physical Exam BP 120/80  Pulse 91  Temp(Src) 98.4 F (36.9 C)  Resp 16  Ht 5\' 11"  (1.803 m)  Wt 193 lb (87.544 kg)  BMI 26.93 kg/m2  SpO2 96% General appearance: alert, cooperative and appears stated age Head: Normocephalic, without obvious abnormality, atraumatic Eyes: conjunctivae/corneas clear. PERRL, EOM's intact. Fundi benign. Ears: small air-fluid level without erythema, bulging, or purulence Nose: clear discharge, moderate congestion, turbinates red, swollen Throat: post-nasal drip. No uvular deviation or tonsillar exudates Neck: no adenopathy, no carotid bruit, no JVD, supple, symmetrical, trachea midline and thyroid not enlarged, symmetric, no tenderness/mass/nodules Lungs: clear to auscultation bilaterally Heart: regular rate and rhythm, S1, S2 normal, no murmur, click, rub or gallop Extremities: extremities normal, atraumatic, no cyanosis or edema Pulses: 2+ and symmetric Skin:  Skin color, texture, turgor normal. No rashes or lesions Lymph nodes: Cervical, supraclavicular, and axillary nodes normal.     Assessment & Plan:  1. Upper Respiratory Infection, most likely viral etiology -Supportive cares discussed, including rest, hydration, tea with honey -Continue Mucinex as directed -Rx Nasal Atrovent and Tessalon Perles for cough and congestion -Expect improvement over the next 3-5 days -Precautions discussed, RTC if persists beyond 7-10 days or sooner if needed.  Dr. Linnell Fulling, DO Sports Medicine Fellow

## 2013-10-22 NOTE — Patient Instructions (Signed)
Upper Respiratory Infection, Adult An upper respiratory infection (URI) is also known as the common cold. It is often caused by a type of germ (virus). Colds are easily spread (contagious). You can pass it to others by kissing, coughing, sneezing, or drinking out of the same glass. Usually, you get better in 1 or 2 weeks.  HOME CARE   Only take medicine as told by your doctor.  Use a warm mist humidifier or breathe in steam from a hot shower.  Drink enough water and fluids to keep your pee (urine) clear or pale yellow.  Get plenty of rest.  Return to work when your temperature is back to normal or as told by your doctor. You may use a face mask and wash your hands to stop your cold from spreading. GET HELP RIGHT AWAY IF:   After the first few days, you feel you are getting worse.  You have questions about your medicine.  You have chills, shortness of breath, or brown or red spit (mucus).  You have yellow or brown snot (nasal discharge) or pain in the face, especially when you bend forward.  You have a fever, puffy (swollen) neck, pain when you swallow, or white spots in the back of your throat.  You have a bad headache, ear pain, sinus pain, or chest pain.  You have a high-pitched whistling sound when you breathe in and out (wheezing).  You have a lasting cough or cough up blood.  You have sore muscles or a stiff neck. MAKE SURE YOU:   Understand these instructions.  Will watch your condition.  Will get help right away if you are not doing well or get worse. Document Released: 06/08/2007 Document Revised: 03/14/2011 Document Reviewed: 03/27/2013 ExitCare Patient Information 2015 [employer]. This information is not intended to replace advice given to you by your health care provider. Make sure you discuss any questions you have with your health care provider.  

## 2015-02-09 MED FILL — OXYCODONE/APAP 5/325MG: 5-325 | 5 days supply | Qty: 20 | Fill #0

## 2015-02-09 MED FILL — PROMETHAZINE 12.5 MG TABLET: 12.5 | 3 days supply | Qty: 15 | Fill #0

## 2015-02-09 MED FILL — OXYBUTYNIN CL ER 10 MG TAB: 10 | 10 days supply | Qty: 10 | Fill #0

## 2015-10-06 ENCOUNTER — Ambulatory Visit (INDEPENDENT_AMBULATORY_CARE_PROVIDER_SITE_OTHER): Payer: Self-pay | Admitting: Family Medicine

## 2015-10-06 DIAGNOSIS — Z111 Encounter for screening for respiratory tuberculosis: Secondary | ICD-10-CM

## 2015-10-08 LAB — TB SKIN TEST: TB Skin Test: NEGATIVE

## 2016-11-18 ENCOUNTER — Emergency Department (HOSPITAL_COMMUNITY): Payer: BC Managed Care – PPO

## 2016-11-18 ENCOUNTER — Encounter (HOSPITAL_COMMUNITY): Payer: Self-pay | Admitting: Emergency Medicine

## 2016-11-18 ENCOUNTER — Inpatient Hospital Stay (HOSPITAL_COMMUNITY)
Admission: EM | Admit: 2016-11-18 | Discharge: 2016-11-20 | DRG: 312 | Disposition: A | Discharge status: Home / Self Care | Payer: BC Managed Care – PPO | Attending: Internal Medicine | Admitting: Internal Medicine

## 2016-11-18 DIAGNOSIS — Z8612 Personal history of poliomyelitis: Secondary | ICD-10-CM | POA: Diagnosis not present

## 2016-11-18 DIAGNOSIS — R4182 Altered mental status, unspecified: Secondary | ICD-10-CM | POA: Diagnosis present

## 2016-11-18 DIAGNOSIS — R55 Syncope and collapse: Secondary | ICD-10-CM | POA: Diagnosis present

## 2016-11-18 DIAGNOSIS — R159 Full incontinence of feces: Secondary | ICD-10-CM | POA: Diagnosis present

## 2016-11-18 DIAGNOSIS — Z87891 Personal history of nicotine dependence: Secondary | ICD-10-CM | POA: Diagnosis not present

## 2016-11-18 DIAGNOSIS — K449 Diaphragmatic hernia without obstruction or gangrene: Secondary | ICD-10-CM | POA: Diagnosis present

## 2016-11-18 DIAGNOSIS — N179 Acute kidney failure, unspecified: Secondary | ICD-10-CM | POA: Diagnosis present

## 2016-11-18 DIAGNOSIS — K219 Gastro-esophageal reflux disease without esophagitis: Secondary | ICD-10-CM | POA: Diagnosis present

## 2016-11-18 DIAGNOSIS — Z8711 Personal history of peptic ulcer disease: Secondary | ICD-10-CM | POA: Diagnosis not present

## 2016-11-18 DIAGNOSIS — G92 Toxic encephalopathy: Secondary | ICD-10-CM | POA: Diagnosis present

## 2016-11-18 DIAGNOSIS — Z79899 Other long term (current) drug therapy: Secondary | ICD-10-CM

## 2016-11-18 DIAGNOSIS — I10 Essential (primary) hypertension: Secondary | ICD-10-CM | POA: Diagnosis present

## 2016-11-18 DIAGNOSIS — E860 Dehydration: Secondary | ICD-10-CM | POA: Diagnosis present

## 2016-11-18 DIAGNOSIS — I351 Nonrheumatic aortic (valve) insufficiency: Secondary | ICD-10-CM | POA: Diagnosis not present

## 2016-11-18 DIAGNOSIS — N400 Enlarged prostate without lower urinary tract symptoms: Secondary | ICD-10-CM | POA: Diagnosis present

## 2016-11-18 DIAGNOSIS — N289 Disorder of kidney and ureter, unspecified: Secondary | ICD-10-CM

## 2016-11-18 DIAGNOSIS — E871 Hypo-osmolality and hyponatremia: Secondary | ICD-10-CM | POA: Diagnosis present

## 2016-11-18 HISTORY — DX: Malignant (primary) neoplasm, unspecified: C80.1

## 2016-11-18 HISTORY — DX: Disorder of kidney and ureter, unspecified: N28.9

## 2016-11-18 LAB — COMPREHENSIVE METABOLIC PANEL
ALT: 15 U/L — ABNORMAL LOW (ref 17–63)
AST: 19 U/L (ref 15–41)
Albumin: 3.6 g/dL (ref 3.5–5.0)
Alkaline Phosphatase: 98 U/L (ref 38–126)
Anion gap: 6 (ref 5–15)
BUN: 25 mg/dL — ABNORMAL HIGH (ref 6–20)
CO2: 23 mmol/L (ref 22–32)
Calcium: 8.5 mg/dL — ABNORMAL LOW (ref 8.9–10.3)
Chloride: 105 mmol/L (ref 101–111)
Creatinine, Ser: 1.56 mg/dL — ABNORMAL HIGH (ref 0.61–1.24)
GFR calc non Af Amer: 43 mL/min — ABNORMAL LOW (ref >60)
GFR, EST AFRICAN AMERICAN: 50 mL/min — AB (ref >60)
Glucose, Bld: 139 mg/dL — ABNORMAL HIGH (ref 65–99)
Potassium: 4.4 mmol/L (ref 3.5–5.1)
Sodium: 134 mmol/L — ABNORMAL LOW (ref 135–145)
Total Bilirubin: 0.6 mg/dL (ref 0.3–1.2)
Total Protein: 6.5 g/dL (ref 6.5–8.1)

## 2016-11-18 LAB — CBC
HCT: 42.9 % (ref 39.0–52.0)
HEMOGLOBIN: 14.2 g/dL (ref 13.0–17.0)
MCH: 27.1 pg (ref 26.0–34.0)
MCHC: 33.1 g/dL (ref 30.0–36.0)
MCV: 81.9 fL (ref 78.0–100.0)
Platelets: 177 10*3/uL (ref 150–400)
RBC: 5.24 MIL/uL (ref 4.22–5.81)
RDW: 16.1 % — ABNORMAL HIGH (ref 11.5–15.5)
WBC: 11.3 10*3/uL — ABNORMAL HIGH (ref 4.0–10.5)

## 2016-11-18 LAB — URINALYSIS, ROUTINE W REFLEX MICROSCOPIC
Bilirubin Urine: NEGATIVE
Glucose, UA: NEGATIVE mg/dL
Hgb urine dipstick: NEGATIVE
Ketones, ur: NEGATIVE mg/dL
Leukocytes, UA: NEGATIVE
Nitrite: NEGATIVE
PH: 5 (ref 5.0–8.0)
PROTEIN: NEGATIVE mg/dL
Specific Gravity, Urine: 1.028 (ref 1.005–1.030)

## 2016-11-18 LAB — I-STAT CG4 LACTIC ACID, ED
Lactic Acid, Venous: 1.27 mmol/L (ref 0.5–1.9)
Lactic Acid, Venous: 2 mmol/L (ref 0.5–1.9)

## 2016-11-18 LAB — I-STAT TROPONIN, ED: Troponin i, poc: 0 ng/mL (ref 0.00–0.08)

## 2016-11-18 LAB — D-DIMER, QUANTITATIVE: D-Dimer, Quant: 1.18 ug/mL-FEU — ABNORMAL HIGH (ref 0.00–0.50)

## 2016-11-18 MED ORDER — SODIUM CHLORIDE 0.9 % IV BOLUS (SEPSIS)
1000.0000 mL | Freq: Once | INTRAVENOUS | Status: AC
Start: 2016-11-18 — End: 2016-11-18
  Administered 2016-11-18: 1000 mL via INTRAVENOUS

## 2016-11-18 MED ORDER — ENOXAPARIN SODIUM 40 MG/0.4ML ~~LOC~~ SOLN
40.0000 mg | SUBCUTANEOUS | Status: DC
  Administered 2016-11-19 – 2016-11-20 (×2): 40 mg via SUBCUTANEOUS
  Filled 2016-11-18 (×2): qty 0.4

## 2016-11-18 MED ORDER — SODIUM CHLORIDE 0.9 % IV SOLN
INTRAVENOUS | Status: AC
  Administered 2016-11-18: via INTRAVENOUS
  Administered 2016-11-19: 1000 mL via INTRAVENOUS

## 2016-11-18 MED ORDER — LORAZEPAM 2 MG/ML IJ SOLN: 2.0000 mg | INTRAMUSCULAR | Status: DC | PRN

## 2016-11-18 MED ORDER — SODIUM CHLORIDE 0.9 % IV BOLUS (SEPSIS)
1000.0000 mL | Freq: Once | INTRAVENOUS | Status: AC
  Administered 2016-11-18: 1000 mL via INTRAVENOUS

## 2016-11-18 MED ORDER — ACETAMINOPHEN 500 MG PO TABS
1000.0000 mg | ORAL_TABLET | Freq: Once | ORAL | Status: AC
  Administered 2016-11-18: 1000 mg via ORAL
  Filled 2016-11-18: qty 2

## 2016-11-18 MED ORDER — ACETAMINOPHEN 650 MG RE SUPP: 650.0000 mg | Freq: Four times a day (QID) | RECTAL | Status: DC | PRN

## 2016-11-18 MED ORDER — ACETAMINOPHEN 325 MG PO TABS
650.0000 mg | ORAL_TABLET | Freq: Four times a day (QID) | ORAL | Status: DC | PRN
  Administered 2016-11-19 – 2016-11-20 (×3): 650 mg via ORAL
  Filled 2016-11-18 (×4): qty 2

## 2016-11-18 MED ORDER — IOPAMIDOL (ISOVUE-370) INJECTION 76%
INTRAVENOUS | Status: AC
  Administered 2016-11-18: 100 mL
  Filled 2016-11-18: qty 100

## 2016-11-18 NOTE — ED Triage Notes (Signed)
[place] from airport after [place] syncopal episode , cbg 185 , pt brady in the 40'S but back up to 80's on arrival

## 2016-11-18 NOTE — ED Notes (Signed)
Dr. Dayna Barker not in office informed RN [place] of lactic acid result 2.00

## 2016-11-18 NOTE — Consult Note (Signed)
Neurology Consultation  Reason for Consult: sz vs syncope Referring Physician: Dr Maudie Mercury  CC: seizure/syncope like episode x2  History is obtained from: patient, wife, chart  HPI: Thomas Mcclain is a 71 y.o. 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  He was awake throughout this episode and remembers this completely.  This lasted for about 1-2 minutes minutes.  She denied having noticed any gaze deviation at the time.  While she was trying to get some help, this happened again for about another 2-3 minutes and this time was accompanied by loss of bowel and bladder. During both these episodes, the patient did not lose consciousness.  EMS was called.  Upon initial assessment by EMS, his heart rate was in the 40s.  His blood glucose at the time was 185. He is never had similar episodes in the past.  Never had seizures.  No history of stroke.  No history of cardiac problems. Denies any preceding fevers chills chest pain nausea vomiting shortness of breath cough abdominal pain. Review of systems only positive for this episode as described above and lightheadedness.  ROS: A 14 point ROS was performed and is negative except as noted in the HPI.   Past Medical History:  Diagnosis Date  . Allergic rhinitis   . BPH (benign prostatic hyperplasia)   . Cancer ([place])   . Frequency of urination   . Hypertension   . Iron deficiency   . Nocturia   . Renal insufficiency 11/18/2016    Family History  Problem Relation Age of Onset  . Diabetes Mother     Social History:   reports that he quit smoking about 39 years ago. His smoking use included cigarettes. he has never used smokeless tobacco. He reports  that he drinks about 8.4 oz of alcohol per week. He reports that he does not use drugs.  Medications  Current Facility-Administered Medications:  .  0.9 %  sodium chloride infusion, , Intravenous, Continuous, Jani Gravel, MD .  acetaminophen (TYLENOL) tablet 650 mg, 650 mg, Oral, Q6H PRN **OR** acetaminophen (TYLENOL) suppository 650 mg, 650 mg, Rectal, Q6H PRN, Jani Gravel, MD .  Derrill Memo ON 11/19/2016] enoxaparin (LOVENOX) injection 40 mg, 40 mg, Subcutaneous, Q24H, Jani Gravel, MD .  LORazepam (ATIVAN) injection 2 mg, 2 mg, Intravenous, Q4H PRN, Jani Gravel, MD   Exam: Current vital signs: BP 104/77   Pulse (!) 105   Temp 98 F (36.7 C) (Oral)   Resp 17   Ht 6' (1.829 m)   Wt 86.2 kg (190 lb)   SpO2 93%   BMI 25.77 kg/m  Vital signs in last 24 hours: Temp:  [98 F (36.7 C)] 98 F (36.7 C) (11/16 1414) Pulse Rate:  [91-111] 105 (11/16 2300) Resp:  [14-29] 17 (11/16 2300) BP: (93-129)/(55-95) 104/77 (11/16 2300) SpO2:  [93 %-100 %] 93 % (11/16 2300) Weight:  [86.2 kg (190 lb)] 86.2 kg (190 lb) (11/16 2304)  GENERAL: Awake, alert in NAD HEENT: - Normocephalic and atraumatic, dry mm, no LN++, no Thyromegally LUNGS - Clear to auscultation bilaterally with no wheezes CV - S1S2 RRR, no m/r/g, equal pulses bilaterally. ABDOMEN - Soft, nontender, nondistended with normoactive BS Ext:  warm, well perfused, intact peripheral pulses,no edema  NEURO:  Mental Status: AA&Ox3 .  Memory normal for short-term and long-term recollection of events. Language: speech is clear.  Naming, repetition, fluency, and comprehension intact. Cranial Nerves: PERRL  EOMI, visual fields full, no facial asymmetry,facial sensation intact, hearing intact, tongue/uvula/soft palate midline, normal sternocleidomastoid and trapezius muscle strength. No evidence of tongue atrophy or fibrillations Motor: 5/5 b/l UE. 5/5 b/l LE -with the exception of the fused left ankle and decreased left lower extremity mass which is  his baseline because of his polio Tone: is normal Sensation- Intact to light touch bilaterally Coordination: FTN intact bilaterally, no ataxia in BLE. Gait-walks with a limp towards the left because of his polio-that is baseline for him.  Labs I have reviewed labs in epic and the results pertinent to this consultation are:  CBC    Component Value Date/Time   WBC 11.3 (H) 11/18/2016 1413   RBC 5.24 11/18/2016 1413   HGB 14.2 11/18/2016 1413   HCT 42.9 11/18/2016 1413   PLT 177 11/18/2016 1413   MCV 81.9 11/18/2016 1413   MCH 27.1 11/18/2016 1413   MCHC 33.1 11/18/2016 1413   RDW 16.1 (H) 11/18/2016 1413   LYMPHSABS 1.8 05/23/2013 0320   MONOABS 0.8 05/23/2013 0320   EOSABS 0.6 05/23/2013 0320   BASOSABS 0.0 05/23/2013 0320    CMP     Component Value Date/Time   NA 134 (L) 11/18/2016 1413   K 4.4 11/18/2016 1413   CL 105 11/18/2016 1413   CO2 23 11/18/2016 1413   GLUCOSE 139 (H) 11/18/2016 1413   BUN 25 (H) 11/18/2016 1413   CREATININE 1.56 (H) 11/18/2016 1413   CALCIUM 8.5 (L) 11/18/2016 1413   PROT 6.5 11/18/2016 1413   ALBUMIN 3.6 11/18/2016 1413   AST 19 11/18/2016 1413   ALT 15 (L) 11/18/2016 1413   ALKPHOS 98 11/18/2016 1413   BILITOT 0.6 11/18/2016 1413   GFRNONAA 43 (L) 11/18/2016 1413   GFRAA 50 (L) 11/18/2016 1413    Lipid Panel     Component Value Date/Time   CHOL 158 10/17/2012 0944   TRIG 127 10/17/2012 0944   HDL 35 (L) 10/17/2012 0944   CHOLHDL 4.5 10/17/2012 0944   VLDL 25 10/17/2012 0944   LDLCALC 98 10/17/2012 0944   Imaging I have reviewed the images obtained: CT-scan of the brain shows no acute changes.  Assessment:  71 year old with past history of BPH hypertension who had 2 episodes of generalized body trembling without loss of consciousness but 1 of those episodes associated with loss of bowel and bladder control concerning for seizure. In my opinion, this probably was a syncopal episode due to the bradycardia and was associated  with syncopal convulsions but seizures cannot be completely ruled out and I would not be aversive to recommending brain imaging and EEG. His labs also reveal elevated creatinine, elevated glucose and very mild hyponatremia.  Impression: Syncope Syncopal convulsions Evaluate for seizures Toxic/metabolic encephalopathy  Recommendations: Maintain seizure precautions EEG routine MRI brain with without contrast No antiepileptics for now Correction of toxic metabolic derangements per primary team. Neurology to follow with you after test results become available  -- Amie Portland, MD [facility] [phone] If 7pm to 7am, please call on call as listed on AMION.

## 2016-11-18 NOTE — ED Notes (Signed)
Pt standing at bedside to void in urinal.  Tolerated it well

## 2016-11-18 NOTE — ED Notes (Signed)
Returned from ct scan 

## 2016-11-18 NOTE — ED Provider Notes (Signed)
Emergency Department Provider Note   I have reviewed the triage vital signs and the nursing notes.   HISTORY  Chief Complaint Loss of Consciousness   HPI Thomas Mcclain is a 71 y.o. male multiple medical problems as documented below the presents the emergency department today secondary to an altered mental status event.  He was apparently at the airport sitting in chair having a normal conversation with his daughter when he said he started to feel little bit dizzy he does not remember anything after that but his family relays that he said he was nauseous and started throwing up and then lost consciousness.  He became unresponsive for a few seconds and then started having whole body jerking with his eyes rolled back of his head.  At this time he defecated and urinated on himself.  This lasted approximately 5 minutes then patient was on the ground and woke up he briefly said that he would be okay until his family to sit down and then he had 2 more minutes of the same activity.  She came out of that second event and EMS was there.  Per the nurse's note he was bradycardic in the 40s with a CBG of 185.  At this time he has no complaints.  Review of systems he states he is recently developed a mild cough which he took Mucinex this morning.  No chest pain, headache, vision changes, abdominal pain, back pain or other associated or modifying symptoms.  States he does have polyuria but relates it to his hydrochlorothiazide.   Past Medical History:  Diagnosis Date  . Allergic rhinitis   . BPH (benign prostatic hyperplasia)   . Cancer ([place])   . Frequency of urination   . Hypertension   . Iron deficiency   . Nocturia   . Renal insufficiency 11/18/2016    Patient Active Problem List   Diagnosis Date Noted  . Altered mental status 11/18/2016  . Renal insufficiency 11/18/2016  . Acute esophagitis 05/24/2013  . Hiatal hernia 05/24/2013  . Upper gastrointestinal bleed 05/23/2013  . GI bleed  05/23/2013  . Acute posthemorrhagic anemia 05/23/2013  . Syncope 05/23/2013  . Essential hypertension, benign 10/17/2012  . Anemia, iron deficiency 10/17/2012  . Benign prostate hyperplasia 01/31/2011    Past Surgical History:  Procedure Laterality Date  . ABDOMINAL SURGERY    . ESOPHAGOGASTRODUODENOSCOPY (EGD) N/A 05/23/2013   Performed by Ladene Artist, MD at [facility] ENDOSCOPY  . LEFT ANKLE DEBRIDEMENT AND FUSION  2010  . TRANSURETHRAL RESECTION OF THE PROSTATE WITH GYRUS INSTRUMENTS N/A 01/31/2011   Performed by Bernestine Amass, MD at [facility]      Allergies Patient has no known allergies.  Family History  Problem Relation Age of Onset  . Diabetes Mother     Social History Social History   Tobacco Use  . Smoking status: Former Smoker    Types: Cigarettes    Last attempt to quit: 01/24/1977    Years since quitting: 39.8  . Smokeless tobacco: Never Used  Substance Use Topics  . Alcohol use: Yes    Alcohol/week: 8.4 oz    Types: 14 Cans of beer per week  . Drug use: No    Review of Systems  All other systems negative except as documented in the HPI. All pertinent positives and negatives as reviewed in the HPI. ____________________________________________   PHYSICAL EXAM:  VITAL SIGNS: ED Triage Vitals [11/18/16 1414]  Enc Vitals Group  BP (!) 93/55     Pulse      Resp 15     Temp 98 F (36.7 C)     Temp Source Oral     SpO2 95 %    Constitutional: Alert and oriented. Well appearing and in no acute distress. Eyes: Conjunctivae are normal. PERRL. EOMI. Head: Atraumatic. Nose: No congestion/rhinnorhea. Mouth/Throat: Mucous membranes are moist.  Oropharynx non-erythematous. Neck: No stridor.  No meningeal signs.   Cardiovascular: Normal rate, regular rhythm. Good peripheral circulation. Grossly normal heart sounds.   Respiratory: mildly tachypneic respiratory effort.  No retractions. Lungs CTAB. Gastrointestinal: Soft and nontender.  No distention.  Musculoskeletal: No lower extremity tenderness nor edema. No gross deformities of extremities. Neurologic:  Normal speech and language. No gross focal neurologic deficits are appreciated.  Skin:  Skin is warm, dry and intact. No rash noted.  ____________________________________________   LABS (all labs ordered are listed, but only abnormal results are displayed)  Labs Reviewed  CBC - Abnormal; Notable for the following components:      Result Value   WBC 11.3 (*)    RDW 16.1 (*)    All other components within normal limits  COMPREHENSIVE METABOLIC PANEL - Abnormal; Notable for the following components:   Sodium 134 (*)    Glucose, Bld 139 (*)    BUN 25 (*)    Creatinine, Ser 1.56 (*)    Calcium 8.5 (*)    ALT 15 (*)    GFR calc non Af Amer 43 (*)    GFR calc Af Amer 50 (*)    All other components within normal limits  D-DIMER, QUANTITATIVE (NOT AT [facility]) - Abnormal; Notable for the following components:   D-Dimer, Quant 1.18 (*)    All other components within normal limits  I-STAT CG4 LACTIC ACID, ED - Abnormal; Notable for the following components:   Lactic Acid, Venous 2.00 (*)    All other components within normal limits  URINALYSIS, ROUTINE W REFLEX MICROSCOPIC  TROPONIN I  CORTISOL  MAGNESIUM  COMPREHENSIVE METABOLIC PANEL  CBC  TSH  TROPONIN I  TROPONIN I  I-STAT TROPONIN, ED  I-STAT CG4 LACTIC ACID, ED   ____________________________________________  EKG   EKG Interpretation  Date/Time:  Friday November 18 2016 14:15:23 EST Ventricular Rate:  85 PR Interval:    QRS Duration: 90 QT Interval:  359 QTC Calculation: 427 R Axis:   -43 Text Interpretation:  Sinus rhythm Left anterior fascicular block Probable anteroseptal infarct, old No significant change since last tracing in 2015 Confirmed by Merrily Pew [phone]) on 11/18/2016 3:08:44 PM       ____________________________________________  RADIOLOGY  Ct Head Wo Contrast  Result  Date: 11/18/2016 CLINICAL DATA:  Syncopal episode, vomiting at airport today. History of hypertension. EXAM: CT HEAD WITHOUT CONTRAST TECHNIQUE: Contiguous axial images were obtained from the base of the skull through the vertex without intravenous contrast. COMPARISON:  None. FINDINGS: BRAIN: No intraparenchymal hemorrhage, mass effect nor midline shift. The ventricles and sulci are upper limits of normal for age. Cavum septum pellucidum, normal variant. Patchy supratentorial white matter hypodensities less than expected for patient's age, though non-specific are most compatible with chronic small vessel ischemic disease. No acute large vascular territory infarcts. No abnormal extra-axial fluid collections. Basal cisterns are patent. VASCULAR: Mild calcific atherosclerosis of the carotid siphons. SKULL: No skull fracture. Posterior arch of C1 is developmentally the at fused. No significant scalp soft tissue swelling. SINUSES/ORBITS: Mild lobulated paranasal sinus mucosal thickening  without air-fluid levels. Mastoid air cells are well aerated. The included ocular globes and orbital contents are non-suspicious. LEFT optic disc drusen. OTHER: None. IMPRESSION: 1. No acute intracranial process. 2. Borderline parenchymal brain volume loss for age. Mild chronic small vessel ischemic disease. Electronically Signed   By: Elon Alas M.D.   On: 11/18/2016 18:06   Ct Angio Chest Pe W And/or Wo Contrast  Result Date: 11/18/2016 CLINICAL DATA:  Chest pain today with a syncopal episode and vomiting. Elevated D-dimer. EXAM: CT ANGIOGRAPHY CHEST WITH CONTRAST TECHNIQUE: Multidetector CT imaging of the chest was performed using the standard protocol during bolus administration of intravenous contrast. Multiplanar CT image reconstructions and MIPs were obtained to evaluate the vascular anatomy. CONTRAST:  100 cc Isovue 370 COMPARISON:  Portable chest obtained earlier today. FINDINGS: Cardiovascular: Normally opacified  pulmonary arteries with no pulmonary arterial filling defects. Borderline enlarged heart. Mild atheromatous arterial calcifications including the thoracic aorta. Mediastinum/Nodes: Small to moderate-sized hiatal hernia with a paraesophageal component. No enlarged lymph nodes. Unremarkable thyroid gland. Lungs/Pleura: Minimal bilateral lower lobe atelectasis. No pleural fluid. Upper Abdomen: Stable mildly elevated left hemidiaphragm. Upper pole left renal cyst. Musculoskeletal: Mild thoracic and lower cervical spine degenerative changes. Multiple bilateral rib bone islands. Review of the MIP images confirms the above findings. IMPRESSION: 1. No pulmonary emboli or acute abnormality. 2. Small to moderate-sized hiatal hernia with a paraesophageal component. 3. Minimal bibasilar atelectasis. 4. Minimal aortic atherosclerosis. Aortic Atherosclerosis (ICD10-I70.0). Electronically Signed   By: Claudie Revering M.D.   On: 11/18/2016 18:08   Dg Chest Portable 1 View  Result Date: 11/18/2016 CLINICAL DATA:  Syncope EXAM: PORTABLE CHEST 1 VIEW COMPARISON:  None FINDINGS: No edema or consolidation. Heart is borderline enlarged with pulmonary vascularity within normal limits. No adenopathy. No pneumothorax. No bone lesions. IMPRESSION: Heart borderline enlarged.  No edema or consolidation. Electronically Signed   By: Lowella Grip III M.D.   On: 11/18/2016 14:40    ____________________________________________   PROCEDURES  Procedure(s) performed:   Procedures   ____________________________________________   INITIAL IMPRESSION / ASSESSMENT AND PLAN / ED COURSE  Pertinent labs & imaging results that were available during my care of the patient were reviewed by me and considered in my medical decision making (see chart for details).  71 year old male here with an episode that sounds consistent with possible seizure versus syncope with myoclonic jerks.  Possibly neurologic versus cardiac cause.  Suspect with  his initial heart rate in the 40s.  Does not seem like he had any reason for a vagal event. Will likely need observation.   ____________________________________________  FINAL CLINICAL IMPRESSION(S) / ED DIAGNOSES  Final diagnoses:  Syncope, unspecified syncope type  AKI (acute kidney injury) ([place])     MEDICATIONS GIVEN DURING THIS VISIT:  Medications  enoxaparin (LOVENOX) injection 40 mg (not administered)  0.9 %  sodium chloride infusion ( Intravenous New Bag/Given 11/18/16 2350)  acetaminophen (TYLENOL) tablet 650 mg (not administered)    Or  acetaminophen (TYLENOL) suppository 650 mg (not administered)  LORazepam (ATIVAN) injection 2 mg (not administered)  sodium chloride 0.9 % bolus 1,000 mL (0 mLs Intravenous Stopped 11/18/16 1511)  sodium chloride 0.9 % bolus 1,000 mL (0 mLs Intravenous Stopped 11/18/16 1902)  acetaminophen (TYLENOL) tablet 1,000 mg (1,000 mg Oral Given 11/18/16 2057)     NEW OUTPATIENT MEDICATIONS STARTED DURING THIS VISIT:  This SmartLink is deprecated. Use AVSMEDLIST instead to display the medication list for a patient.  Note:  This document was  prepared using Systems analyst and may include unintentional dictation errors.   Dian Laprade, Corene Cornea, MD 11/19/16 [phone]

## 2016-11-18 NOTE — H&P (Addendum)
TRH H&P   Patient Demographics:    Thomas Mcclain, is a 71 y.o. male  MRN: [id number]   DOB - [birth year]  Admit Date - 11/18/2016  Outpatient Primary MD for the patient is Tresa Garter, MD  Referring MD/NP/PA: Dr. Dolly Rias   Outpatient Specialists:  Lucio Edward (GI)  Patient coming from: home  Chief Complaint  Patient presents with  . Loss of Consciousness      HPI:    Thomas Mcclain  is a 71 y.o. male, w hx of PUD, hiatal hernia, Jerrye Bushy, Hypertension, presents with  Going to the airport to go to [facility].  Pt felt dizzy at the airport.  Then he had n/v, and his eyes rolled back and was shaking over. Lasted about 5 minutes, and had a 2nd similar episode lasting 30minutes and had some confusion after the 2nd event.  + fecal incontinence.  No tongue laceration.  pt denies cp, palp, sob, diarrhea, brbpr, black stool.      In ED,  Wbc 11.3, Hgb 14.2, Plt 177 Na 134, Bun 25, Creatiine 1.56 Ast 19, Alt 15 Trop I 0.00 LA 1.27 D dimer 1.18,    CT brain  IMPRESSION: 1. No acute intracranial process. 2. Borderline parenchymal brain volume loss for age. Mild chronic small vessel ischemic disease.   CTA chest  IMPRESSION: 1. No pulmonary emboli or acute abnormality. 2. Small to moderate-sized hiatal hernia with a paraesophageal component. 3. Minimal bibasilar atelectasis. 4. Minimal aortic atherosclerosis.  nsr at 85, Lad, poor R progression, st j point elevation in v2, v3, (per ED, old).   Pt will be admitted for ? Syncope vs seizure.       Review of systems:    In addition to the HPI above,  No Fever-chills, No Headache, No changes with Vision or hearing, No problems swallowing food or Liquids, No Chest pain, Cough or Shortness of Breath, No Abdominal pain, Bowel movements are regular, No Blood in stool or Urine, No dysuria, No new skin rashes  or bruises, No new joints pains-aches,  No new weakness, tingling, numbness in any extremity, No recent weight gain or loss, No polyuria, polydypsia or polyphagia, No significant Mental Stressors.  A full 10 point Review of Systems was done, except as stated above, all other Review of Systems were negative.   With Past History of the following :    Past Medical History:  Diagnosis Date  . Allergic rhinitis   . BPH (benign prostatic hyperplasia)   . Cancer ([place])   . Frequency of urination   . Hypertension   . Iron deficiency   . Nocturia   . Renal insufficiency 11/18/2016      Past Surgical History:  Procedure Laterality Date  . ABDOMINAL SURGERY    . ESOPHAGOGASTRODUODENOSCOPY (EGD) N/A 05/23/2013   Performed by Ladene Artist, MD at [facility] ENDOSCOPY  .  LEFT ANKLE DEBRIDEMENT AND FUSION  2010  . TRANSURETHRAL RESECTION OF THE PROSTATE WITH GYRUS INSTRUMENTS N/A 01/31/2011   Performed by Bernestine Amass, MD at [facility]      Social History:     Social History   Tobacco Use  . Smoking status: Former Smoker    Types: Cigarettes    Last attempt to quit: 01/24/1977    Years since quitting: 39.8  . Smokeless tobacco: Never Used  Substance Use Topics  . Alcohol use: Yes    Alcohol/week: 8.4 oz    Types: 14 Cans of beer per week     Lives - at home  Mobility - walks by self   Family History :     Family History  Problem Relation Age of Onset  . Diabetes Mother       Home Medications:   Prior to Admission medications   Medication Sig Start Date End Date Taking? Authorizing Provider  aspirin-acetaminophen-caffeine (EXCEDRIN MIGRAINE) [phone] MG tablet Take 1 tablet every 6 (six) hours as needed by mouth for migraine.   Yes [provider]  ferrous sulfate 325 (65 FE) MG tablet Take 325 mg daily by mouth.   Yes [provider]  lisinopril-hydrochlorothiazide (PRINZIDE,ZESTORETIC) 20-25 MG per tablet Take 1 tablet by mouth  daily.   Yes [provider]  loratadine-pseudoephedrine (CLARITIN-D 12-HOUR) 5-120 MG per tablet Take 1 tablet by mouth as needed.   Yes [provider]  vitamin B-12 (CYANOCOBALAMIN) 1000 MCG tablet Take 1,000 mcg daily by mouth.   Yes [provider]  benzonatate (TESSALON) 100 MG capsule Take 1 capsule (100 mg total) by mouth 2 (two) times daily as needed for cough. Patient not taking: Reported on 11/18/2016 10/22/13   Pick-Jacobs, John C, DO  ipratropium (ATROVENT) 0.03 % nasal spray Place 2 sprays into both nostrils 2 (two) times daily. Patient not taking: Reported on 11/18/2016 10/22/13   Pick-Jacobs, John C, DO     Allergies:    No Known Allergies   Physical Exam:   Vitals  Blood pressure 106/86, pulse (!) 106, temperature 98 F (36.7 C), temperature source Oral, resp. rate 19, SpO2 96 %.   1. General  lying in bed in NAD,    2. Normal affect and insight, Not Suicidal or Homicidal, Awake Alert, Oriented X 3.  3. No F.N deficits, ALL C.Nerves Intact, Strength 5/5 all 4 extremities, Sensation intact all 4 extremities, Plantars down going.  4. Ears and Eyes appear Normal, Conjunctivae clear, PERRLA. Moist Oral Mucosa.  5. Supple Neck, No JVD, No cervical lymphadenopathy appriciated, No Carotid Bruits.  6. Symmetrical Chest wall movement, Good air movement bilaterally, CTAB.  7. RRR, No Gallops, Rubs or Murmurs, No Parasternal Heave.  8. Positive Bowel Sounds, Abdomen Soft, No tenderness, No organomegaly appriciated,No rebound -guarding or rigidity.  9.  No Cyanosis, Normal Skin Turgor, No Skin Rash or Bruise.  10. Good muscle tone,  joints appear normal , no effusions, Normal ROM.  11. No Palpable Lymph Nodes in Neck or Axillae     Data Review:    CBC Recent Labs  Lab 11/18/16 1413  WBC 11.3*  HGB 14.2  HCT 42.9  PLT 177  MCV 81.9  MCH 27.1  MCHC 33.1  RDW 16.1*    ------------------------------------------------------------------------------------------------------------------  Chemistries  Recent Labs  Lab 11/18/16 1413  NA 134*  K 4.4  CL 105  CO2 23  GLUCOSE 139*  BUN 25*  CREATININE 1.56*  CALCIUM 8.5*  AST 19  ALT 15*  ALKPHOS 98  BILITOT 0.6   ------------------------------------------------------------------------------------------------------------------ CrCl cannot be calculated (Unknown ideal weight.). ------------------------------------------------------------------------------------------------------------------ No results for input(s): TSH, T4TOTAL, T3FREE, THYROIDAB in the last 72 hours.  Invalid input(s): FREET3  Coagulation profile No results for input(s): INR, PROTIME in the last 168 hours. ------------------------------------------------------------------------------------------------------------------- Recent Labs    11/18/16 1513  DDIMER 1.18*   -------------------------------------------------------------------------------------------------------------------  Cardiac Enzymes No results for input(s): CKMB, TROPONINI, MYOGLOBIN in the last 168 hours.  Invalid input(s): CK ------------------------------------------------------------------------------------------------------------------ No results found for: BNP   ---------------------------------------------------------------------------------------------------------------  Urinalysis    Component Value Date/Time   COLORURINE YELLOW 11/18/2016 1809   APPEARANCEUR CLEAR 11/18/2016 1809   LABSPEC 1.028 11/18/2016 1809   PHURINE 5.0 11/18/2016 1809   GLUCOSEU NEGATIVE 11/18/2016 1809   HGBUR NEGATIVE 11/18/2016 1809   BILIRUBINUR NEGATIVE 11/18/2016 1809   KETONESUR NEGATIVE 11/18/2016 1809   PROTEINUR NEGATIVE 11/18/2016 1809   UROBILINOGEN 0.2 05/23/2013 0650   NITRITE NEGATIVE 11/18/2016 1809   LEUKOCYTESUR NEGATIVE 11/18/2016 1809     ----------------------------------------------------------------------------------------------------------------   Imaging Results:    Ct Head Wo Contrast  Result Date: 11/18/2016 CLINICAL DATA:  Syncopal episode, vomiting at airport today. History of hypertension. EXAM: CT HEAD WITHOUT CONTRAST TECHNIQUE: Contiguous axial images were obtained from the base of the skull through the vertex without intravenous contrast. COMPARISON:  None. FINDINGS: BRAIN: No intraparenchymal hemorrhage, mass effect nor midline shift. The ventricles and sulci are upper limits of normal for age. Cavum septum pellucidum, normal variant. Patchy supratentorial white matter hypodensities less than expected for patient's age, though non-specific are most compatible with chronic small vessel ischemic disease. No acute large vascular territory infarcts. No abnormal extra-axial fluid collections. Basal cisterns are patent. VASCULAR: Mild calcific atherosclerosis of the carotid siphons. SKULL: No skull fracture. Posterior arch of C1 is developmentally the at fused. No significant scalp soft tissue swelling. SINUSES/ORBITS: Mild lobulated paranasal sinus mucosal thickening without air-fluid levels. Mastoid air cells are well aerated. The included ocular globes and orbital contents are non-suspicious. LEFT optic disc drusen. OTHER: None. IMPRESSION: 1. No acute intracranial process. 2. Borderline parenchymal brain volume loss for age. Mild chronic small vessel ischemic disease. Electronically Signed   By: Elon Alas M.D.   On: 11/18/2016 18:06   Ct Angio Chest Pe W And/or Wo Contrast  Result Date: 11/18/2016 CLINICAL DATA:  Chest pain today with a syncopal episode and vomiting. Elevated D-dimer. EXAM: CT ANGIOGRAPHY CHEST WITH CONTRAST TECHNIQUE: Multidetector CT imaging of the chest was performed using the standard protocol during bolus administration of intravenous contrast. Multiplanar CT image reconstructions and MIPs  were obtained to evaluate the vascular anatomy. CONTRAST:  100 cc Isovue 370 COMPARISON:  Portable chest obtained earlier today. FINDINGS: Cardiovascular: Normally opacified pulmonary arteries with no pulmonary arterial filling defects. Borderline enlarged heart. Mild atheromatous arterial calcifications including the thoracic aorta. Mediastinum/Nodes: Small to moderate-sized hiatal hernia with a paraesophageal component. No enlarged lymph nodes. Unremarkable thyroid gland. Lungs/Pleura: Minimal bilateral lower lobe atelectasis. No pleural fluid. Upper Abdomen: Stable mildly elevated left hemidiaphragm. Upper pole left renal cyst. Musculoskeletal: Mild thoracic and lower cervical spine degenerative changes. Multiple bilateral rib bone islands. Review of the MIP images confirms the above findings. IMPRESSION: 1. No pulmonary emboli or acute abnormality. 2. Small to moderate-sized hiatal hernia with a paraesophageal component. 3. Minimal bibasilar atelectasis. 4. Minimal aortic atherosclerosis. Aortic Atherosclerosis (ICD10-I70.0). Electronically Signed   By: Claudie Revering M.D.   On: 11/18/2016 18:08  Dg Chest Portable 1 View  Result Date: 11/18/2016 CLINICAL DATA:  Syncope EXAM: PORTABLE CHEST 1 VIEW COMPARISON:  None FINDINGS: No edema or consolidation. Heart is borderline enlarged with pulmonary vascularity within normal limits. No adenopathy. No pneumothorax. No bone lesions. IMPRESSION: Heart borderline enlarged.  No edema or consolidation. Electronically Signed   By: Lowella Grip III M.D.   On: 11/18/2016 14:40      Assessment & Plan:    Principal Problem:   Altered mental status Active Problems:   Renal insufficiency    AMS ? Syncope vs seizure Tele Trop I q6h x3 Check cardiac echo Check carotid ultrasound Check MRI brain Check EEG Per neurology no antieplileptic needed Please consult neurology in am  Renal insufficiency Hydrate with ns iv Check cmp in am  Tachycardia ?  dehydration Trop I q6h x3 Check tsh Check cardiac echo Hydrate with ns iv  Hypertension Slightly soft bp STOP prinzide  Hiatal hernia with paraesophageal component Pt denies any chest pain.     DVT Prophylaxis  Lovenox - SCDs   AM Labs Ordered, also please review Full Orders  Family Communication: Admission, patients condition and plan of care including tests being ordered have been discussed with the patient  who indicate understanding and agree with the plan and Code Status.  Code Status FULL CODE  Likely DC to  home  Condition GUARDED    Consults called: please contact neurology in AM  Admission status: inpatient  Time spent in minutes : 45    Jani Gravel M.D on 11/18/2016 at 8:12 PM  Between 7am to 7pm - Pager - [phone]  . After 7pm go to [email or web address] - password [facility]  [facility] - Office  [phone]

## 2016-11-19 ENCOUNTER — Encounter (HOSPITAL_COMMUNITY): Payer: Medicare Other

## 2016-11-19 ENCOUNTER — Inpatient Hospital Stay (HOSPITAL_COMMUNITY): Payer: BC Managed Care – PPO

## 2016-11-19 LAB — CBC
HCT: 37.5 % — ABNORMAL LOW (ref 39.0–52.0)
HEMOGLOBIN: 12.6 g/dL — AB (ref 13.0–17.0)
MCH: 27.2 pg (ref 26.0–34.0)
MCHC: 33.6 g/dL (ref 30.0–36.0)
MCV: 81 fL (ref 78.0–100.0)
PLATELETS: 168 10*3/uL (ref 150–400)
RBC: 4.63 MIL/uL (ref 4.22–5.81)
RDW: 16.5 % — ABNORMAL HIGH (ref 11.5–15.5)
WBC: 6.6 10*3/uL (ref 4.0–10.5)

## 2016-11-19 LAB — COMPREHENSIVE METABOLIC PANEL
ALBUMIN: 3.2 g/dL — AB (ref 3.5–5.0)
ALT: 13 U/L — ABNORMAL LOW (ref 17–63)
ANION GAP: 7 (ref 5–15)
AST: 17 U/L (ref 15–41)
Alkaline Phosphatase: 79 U/L (ref 38–126)
BUN: 18 mg/dL (ref 6–20)
CHLORIDE: 107 mmol/L (ref 101–111)
CO2: 23 mmol/L (ref 22–32)
Calcium: 8.7 mg/dL — ABNORMAL LOW (ref 8.9–10.3)
Creatinine, Ser: 1.13 mg/dL (ref 0.61–1.24)
GFR calc Af Amer: >60 mL/min (ref >60)
GFR calc non Af Amer: >60 mL/min (ref >60)
GLUCOSE: 86 mg/dL (ref 65–99)
Potassium: 4.5 mmol/L (ref 3.5–5.1)
SODIUM: 137 mmol/L (ref 135–145)
Total Bilirubin: 0.7 mg/dL (ref 0.3–1.2)
Total Protein: 5.8 g/dL — ABNORMAL LOW (ref 6.5–8.1)

## 2016-11-19 LAB — CORTISOL: CORTISOL PLASMA: 4.4 ug/dL

## 2016-11-19 LAB — TROPONIN I

## 2016-11-19 LAB — TSH: TSH: 0.794 u[IU]/mL (ref 0.350–4.500)

## 2016-11-19 MED ORDER — DIPHENHYDRAMINE HCL 25 MG PO CAPS
25.0000 mg | ORAL_CAPSULE | Freq: Four times a day (QID) | ORAL | Status: DC | PRN
  Administered 2016-11-19 – 2016-11-20 (×2): 25 mg via ORAL
  Filled 2016-11-19 (×3): qty 1

## 2016-11-19 MED ORDER — ZOLPIDEM TARTRATE 5 MG PO TABS
5.0000 mg | ORAL_TABLET | Freq: Every evening | ORAL | Status: DC | PRN
  Administered 2016-11-19: 5 mg via ORAL
  Filled 2016-11-19: qty 1

## 2016-11-19 MED ORDER — GADOBENATE DIMEGLUMINE 529 MG/ML IV SOLN
15.0000 mL | Freq: Once | INTRAVENOUS | Status: AC | PRN
  Administered 2016-11-19: 15 mL via INTRAVENOUS

## 2016-11-19 MED ORDER — TRAMADOL HCL 50 MG PO TABS
50.0000 mg | ORAL_TABLET | Freq: Four times a day (QID) | ORAL | Status: DC | PRN
  Administered 2016-11-19: 50 mg via ORAL
  Filled 2016-11-19: qty 1

## 2016-11-19 NOTE — Progress Notes (Signed)
He is awake, alert, interactive and appropriate.  No further seizure-like activity.  His description of the spell does sound very much like seizure, especially duration being almost 5 minutes with postictal state.  MRI brain is negative, EEG is pending.  With these being new onset, will discuss possible antiepileptic therapy with patient, but would favor getting EEG results first.   If he has any further episodes, then I would go ahead and loaded with IV Keppra.  Roland Rack, MD [facility] [phone]  If 7pm- 7am, please page neurology on call as listed in [place].

## 2016-11-19 NOTE — Progress Notes (Signed)
Patient is a 71 y.o caucasian male admitted from ED.  Patient arrived into the unit at 88 30. On arrival patient is alert and oriented x 4 able to walk to the bathroom independently. Patient was oriented to the room and made comfortable. Patient on assessement, patient reported he had a slight headache and had received tylenol at ED, he verbalized the headache is easing at this time. Skin in dry, warm, there is a midline incision to the lower abdomen which patient said he had a surgical incision about a month ago for bowel obstruction.    A small red discoloration is noted on the top of the left foot which patient verbalized it had been there, Left leg is thinner than the right leg due to polio from younger age.  Patient is placed on telemetry monitor as ordered, call light withing reach, no acute distress noted.

## 2016-11-19 NOTE — Progress Notes (Signed)
[facility] Progress Note  Patient: Thomas Mcclain IZT:[id number]   PCP: Tresa Garter, MD DOB: [birth year]   DOA: 11/18/2016   DOS: 11/19/2016   Date of Service: the patient was seen and examined on 11/19/2016  Subjective: Feeling better, no nausea no vomiting no dizziness no lightheadedness.  No focal deficit no chest pain.  Brief hospital course: Pt. with PMH of peptic ulcer disease, hiatal hernia, GERD, HTN; admitted on 11/18/2016, presented with complaint of nausea and vomiting as well as syncopal event, was found to have syncope. Currently further plan is further workup for neurological etiology.  Assessment and Plan: 1.  Syncope. Seizure-like event. Patient had shaking followed by nausea vomiting as well as incontinence of stool. 2 episodes so far. Presented with dehydration with lactic acid. Given IV fluids currently feeling better. MRI brain with and without contrast negative. EEG pending. Echocardiogram pending. Monitor on telemetry. Neurology consultation appreciated.  2.  Acute kidney injury. Continue IV fluids.  3.  HTN. Hold blood pressure medication.  4.  Hiatal hernia. Peptic ulcer disease. Continue to monitor.  Diet: cardiac diet DVT Prophylaxis: subcutaneous Heparin  Advance goals of care discussion: full code  Family Communication: family was present at bedside, at the time of interview. The pt provided permission to discuss medical plan with the family. Opportunity was given to ask question and all questions were answered satisfactorily.   Disposition:  Discharge to home.  Consultants: neurology Procedures: Echocardiogram, EEG  Antibiotics: Anti-infectives (From admission, onward)   None       Objective: Physical Exam: Vitals:   11/18/16 2304 11/18/16 2350 11/19/16 0532 11/19/16 1624  BP:  114/77 110/82 138/84  Pulse:  (!) 102 87 81  Resp:  16 18 20   Temp:  98.4 F (36.9 C) 98.1 F (36.7 C) 98 F (36.7 C)  TempSrc:  Oral  Oral Oral  SpO2:  97% 95% 100%  Weight: 86.2 kg (190 lb) 83.5 kg (184 lb 1.6 oz)    Height: 6' (1.829 m) 6' (1.829 m)      Intake/Output Summary (Last 24 hours) at 11/19/2016 1645 Last data filed at 11/19/2016 1620 Gross per 24 hour  Intake 687.5 ml  Output 500 ml  Net 187.5 ml   Filed Weights   11/18/16 2304 11/18/16 2350  Weight: 86.2 kg (190 lb) 83.5 kg (184 lb 1.6 oz)   General: Alert, Awake and Oriented to Time, Place and Person. Appear in mild distress, affect appropriate Eyes: PERRL, Conjunctiva normal ENT: Oral Mucosa clear moist. Neck: no JVD, no Abnormal Mass Or lumps Cardiovascular: S1 and S2 Present, no Murmur, Peripheral Pulses Present Respiratory: normal respiratory effort, Bilateral Air entry equal and Decreased, no use of accessory muscle, Clear to Auscultation, no Crackles, no wheezes Abdomen: Bowel Sound present, Soft and no tenderness, no hernia Skin: no redness, no Rash, no induration Extremities: no Pedal edema, no calf tenderness Neurologic: Grossly no focal neuro deficit. Bilaterally Equal motor strength  Data Reviewed: CBC: Recent Labs  Lab 11/18/16 1413 11/19/16 0503  WBC 11.3* 6.6  HGB 14.2 12.6*  HCT 42.9 37.5*  MCV 81.9 81.0  PLT 177 382   Basic Metabolic Panel: Recent Labs  Lab 11/18/16 1413 11/19/16 0503  NA 134* 137  K 4.4 4.5  CL 105 107  CO2 23 23  GLUCOSE 139* 86  BUN 25* 18  CREATININE 1.56* 1.13  CALCIUM 8.5* 8.7*    Liver Function Tests: Recent Labs  Lab 11/18/16 1413 11/19/16 0503  AST 19  17  ALT 15* 13*  ALKPHOS 98 79  BILITOT 0.6 0.7  PROT 6.5 5.8*  ALBUMIN 3.6 3.2*   No results for input(s): LIPASE, AMYLASE in the last 168 hours. No results for input(s): AMMONIA in the last 168 hours. Coagulation Profile: No results for input(s): INR, PROTIME in the last 168 hours. Cardiac Enzymes: Recent Labs  Lab 11/18/16 2342 11/19/16 0503 11/19/16 1208  TROPONINI <0.03 <0.03 <0.03   BNP (last 3 results) No  results for input(s): PROBNP in the last 8760 hours. CBG: No results for input(s): GLUCAP in the last 168 hours. Studies: Ct Head Wo Contrast  Result Date: 11/18/2016 CLINICAL DATA:  Syncopal episode, vomiting at airport today. History of hypertension. EXAM: CT HEAD WITHOUT CONTRAST TECHNIQUE: Contiguous axial images were obtained from the base of the skull through the vertex without intravenous contrast. COMPARISON:  None. FINDINGS: BRAIN: No intraparenchymal hemorrhage, mass effect nor midline shift. The ventricles and sulci are upper limits of normal for age. Cavum septum pellucidum, normal variant. Patchy supratentorial white matter hypodensities less than expected for patient's age, though non-specific are most compatible with chronic small vessel ischemic disease. No acute large vascular territory infarcts. No abnormal extra-axial fluid collections. Basal cisterns are patent. VASCULAR: Mild calcific atherosclerosis of the carotid siphons. SKULL: No skull fracture. Posterior arch of C1 is developmentally the at fused. No significant scalp soft tissue swelling. SINUSES/ORBITS: Mild lobulated paranasal sinus mucosal thickening without air-fluid levels. Mastoid air cells are well aerated. The included ocular globes and orbital contents are non-suspicious. LEFT optic disc drusen. OTHER: None. IMPRESSION: 1. No acute intracranial process. 2. Borderline parenchymal brain volume loss for age. Mild chronic small vessel ischemic disease. Electronically Signed   By: Elon Alas M.D.   On: 11/18/2016 18:06   Ct Angio Chest Pe W And/or Wo Contrast  Result Date: 11/18/2016 CLINICAL DATA:  Chest pain today with a syncopal episode and vomiting. Elevated D-dimer. EXAM: CT ANGIOGRAPHY CHEST WITH CONTRAST TECHNIQUE: Multidetector CT imaging of the chest was performed using the standard protocol during bolus administration of intravenous contrast. Multiplanar CT image reconstructions and MIPs were obtained to  evaluate the vascular anatomy. CONTRAST:  100 cc Isovue 370 COMPARISON:  Portable chest obtained earlier today. FINDINGS: Cardiovascular: Normally opacified pulmonary arteries with no pulmonary arterial filling defects. Borderline enlarged heart. Mild atheromatous arterial calcifications including the thoracic aorta. Mediastinum/Nodes: Small to moderate-sized hiatal hernia with a paraesophageal component. No enlarged lymph nodes. Unremarkable thyroid gland. Lungs/Pleura: Minimal bilateral lower lobe atelectasis. No pleural fluid. Upper Abdomen: Stable mildly elevated left hemidiaphragm. Upper pole left renal cyst. Musculoskeletal: Mild thoracic and lower cervical spine degenerative changes. Multiple bilateral rib bone islands. Review of the MIP images confirms the above findings. IMPRESSION: 1. No pulmonary emboli or acute abnormality. 2. Small to moderate-sized hiatal hernia with a paraesophageal component. 3. Minimal bibasilar atelectasis. 4. Minimal aortic atherosclerosis. Aortic Atherosclerosis (ICD10-I70.0). Electronically Signed   By: Claudie Revering M.D.   On: 11/18/2016 18:08   Mr Jeri Cos JY Contrast  Result Date: 11/19/2016 CLINICAL DATA:  Altered level of consciousness, unexplained. EXAM: MRI HEAD WITHOUT AND WITH CONTRAST TECHNIQUE: Multiplanar, multiecho pulse sequences of the brain and surrounding structures were obtained without and with intravenous contrast. CONTRAST:  31mL MULTIHANCE GADOBENATE DIMEGLUMINE 529 MG/ML IV SOLN COMPARISON:  CT head without contrast 11/18/2016 FINDINGS: Brain: The diffusion-weighted images demonstrate no acute or subacute infarction. Mild atrophy and white matter changes are present bilaterally. No acute hemorrhage or mass lesion is present.  Cavum septum pellucidum is noted. Ventricles are normal in size. The internal auditory canals are normal. Brainstem and cerebellum are within normal limits. No significant extra-axial fluid collection is present. The postcontrast  images demonstrate no pathologic enhancement. Vascular: Flow is present in the major intracranial arteries. Skull and upper cervical spine: The skullbase is within normal limits. The craniocervical junction is normal. Midline sagittal structures are unremarkable. Sinuses/Orbits: Mild mucosal thickening is present in the left maxillary sinus and scattered ethmoid air cells. No fluid levels are present. The mastoid air cells are clear. The globes and orbits are within normal limits bilaterally. IMPRESSION: 1. Normal MRI of the brain for age. 2. Mild sinus disease. Electronically Signed   By: San Morelle M.D.   On: 11/19/2016 13:05    Scheduled Meds: . enoxaparin (LOVENOX) injection  40 mg Subcutaneous Q24H   Continuous Infusions: . sodium chloride 1,000 mL (11/19/16 1223)   PRN Meds: acetaminophen **OR** acetaminophen, LORazepam  Time spent: 35 minutes  Author: Berle Mull, MD [facility] Pager: [phone] 11/19/2016 4:45 PM  If 7PM-7AM, please contact night-coverage at [email or web address], password [facility]

## 2016-11-20 ENCOUNTER — Inpatient Hospital Stay (HOSPITAL_COMMUNITY): Payer: BC Managed Care – PPO

## 2016-11-20 ENCOUNTER — Other Ambulatory Visit: Payer: Self-pay

## 2016-11-20 DIAGNOSIS — I351 Nonrheumatic aortic (valve) insufficiency: Secondary | ICD-10-CM

## 2016-11-20 LAB — ECHOCARDIOGRAM COMPLETE
Height: 72 in
LEFT ECA DIAS: -26 cm/s
LEFT VERTEBRAL DIAS: -16 cm/s
Left CCA dist dias: 23 cm/s
Left CCA dist sys: 75 cm/s
Left CCA prox dias: -21 cm/s
Left CCA prox sys: -78 cm/s
Left ICA dist dias: -22 cm/s
Left ICA dist sys: -44 cm/s
Left ICA prox dias: -21 cm/s
Left ICA prox sys: -66 cm/s
RIGHT ECA DIAS: -22 cm/s
RIGHT VERTEBRAL DIAS: -14 cm/s
Right CCA prox dias: 21 cm/s
Right CCA prox sys: 77 cm/s
Right cca dist sys: -43 cm/s
Weight: 2939.2 oz

## 2016-11-20 NOTE — Progress Notes (Signed)
  Echocardiogram 2D Echocardiogram has been performed.  Renuka Farfan T Kyden Potash 11/20/2016, 2:04 PM

## 2016-11-20 NOTE — Progress Notes (Signed)
VASCULAR LAB PRELIMINARY  PRELIMINARY  PRELIMINARY  PRELIMINARY  Carotid duplex completed.    Preliminary report:  1-39% ICA plaquing. Vertebral artery flow is antegrade.   Niccolas Loeper, RVT 11/20/2016, 1:06 PM

## 2016-11-20 NOTE — Discharge Instructions (Signed)
Use caution when using heavy equipment or power tools. Avoid working on ladders or at heights. Take showers instead of baths. Ensure the water temperature is not too high on the home water heater. Do not go swimming alone. When caring for infants or small children, sit down when holding, feeding, or changing them to minimize risk of injury to the child in the event you have an episode Also, Maintain good sleep hygiene. Avoid alcohol.

## 2016-11-20 NOTE — Procedures (Signed)
History: 71 year old male with episodes of syncope versus seizure  Sedation: None  Technique: This is a 21 channel routine scalp EEG performed at the bedside with bipolar and monopolar montages arranged in accordance to the international 10/20 system of electrode placement. One channel was dedicated to EKG recording.    Background: The background consists of intermixed alpha and beta activities. There is a well defined posterior dominant rhythm of 9 hz that attenuates with eye opening.  There is an anterior shifting of the posterior dominant rhythm associated with drowsiness, but sleep is not recorded.  Photic stimulation: Physiologic driving is not performed  EEG Abnormalities: None  Clinical Interpretation: This normal EEG is recorded in the waking and drowsy state. There was no seizure or seizure predisposition recorded on this study. Please note that a normal EEG does not preclude the possibility of epilepsy.   Thomas Rack, MD [facility] [phone]  If 7pm- 7am, please page neurology on call as listed in [place].

## 2016-11-20 NOTE — Evaluation (Signed)
Physical Therapy Evaluation Patient Details Name: Thomas Mcclain MRN: [id number] DOB: 04-12-1945 Today's Date: 11/20/2016   History of Present Illness  Patient is a 71 y/o male who was at the airport, felt dizzy and started having jerking like movements with eyes rolling back in head, happened x2 with fecal and urinary incontinence. Head CT and brain MRI-negative. Concern for seizures vs syncope. HR in the 40s upon arrival. PMH includes HTN, Ca.  Clinical Impression  Patient tolerated gait training and stair training with supervision-Mod I for safety. 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 Reports no symptoms but a headache. BP slightly elevated but responds appropriately to exercise. 142/92 pre activity and 189/97 post activity. Pt functioning close to baseline and does not require skilled therapy services. All education completed. Discharge from therapy.    Follow Up Recommendations No PT follow up    Equipment Recommendations  None recommended by PT    Recommendations for Other Services       Precautions / Restrictions Precautions Precautions: None Restrictions Weight Bearing Restrictions: No      Mobility  Bed Mobility               General bed mobility comments: Sitting in chair upon PT arrival.   Transfers Overall transfer level: Modified independent               General transfer comment: Stood from chair without difficulty.   Ambulation/Gait Ambulation/Gait assistance: Modified independent (Device/Increase time) Ambulation Distance (Feet): 350 Feet Assistive device: None Gait Pattern/deviations: Step-through pattern;Decreased stance time - left;Decreased stance time - right Gait velocity: 3.07 ft/sec Gait velocity interpretation: >2.62 ft/sec, indicative of independent community ambulator General Gait Details: Steady gait with decreased stance time LLE secondary to weakness from polio; no overt LOB. VSS throughout. 2/4 DOE.  Stairs Stairs:  Yes Stairs assistance: Supervision Stair Management: One rail Right;Alternating pattern;Step to pattern Number of Stairs: 13 General stair comments: Ascending with alternating pattern with rail and descending with Bil rail and step to pattern, no difficulties.  Wheelchair Mobility    Modified Rankin (Stroke Patients Only)       Balance Overall balance assessment: Needs assistance Sitting-balance support: Feet supported;No upper extremity supported Sitting balance-Leahy Scale: Good     Standing balance support: During functional activity Standing balance-Leahy Scale: Good                               Pertinent Vitals/Pain Pain Assessment: Faces Faces Pain Scale: Hurts little more Pain Location: head Pain Descriptors / Indicators: Headache Pain Intervention(s): Monitored during session    Home Living Family/patient expects to be discharged to:: Private residence Living Arrangements: Spouse/significant other Available Help at Discharge: Family;Available 24 hours/day Type of Home: House Home Access: Stairs to enter Entrance Stairs-Rails: Right Entrance Stairs-Number of Steps: 5 Home Layout: Multi-level Home Equipment: Walker - 2 wheels;Cane - single point      Prior Function Level of Independence: Independent         Comments: Drives school bus.     Hand Dominance        Extremity/Trunk Assessment   Upper Extremity Assessment Upper Extremity Assessment: Defer to OT evaluation    Lower Extremity Assessment Lower Extremity Assessment: Overall WFL for tasks assessed(hx of polio with residual weakness LLE.)    Cervical / Trunk Assessment Cervical / Trunk Assessment: Normal  Communication   Communication: No difficulties  Cognition Arousal/Alertness: Awake/alert Behavior During Therapy:  WFL for tasks assessed/performed Overall Cognitive Status: Within Functional Limits for tasks assessed                                         General Comments General comments (skin integrity, edema, etc.): Wife present during session.    Exercises     Assessment/Plan    PT Assessment Patent does not need any further PT services  PT Problem List         PT Treatment Interventions      PT Goals (Current goals can be found in the Care Plan section)  Acute Rehab PT Goals Patient Stated Goal: to go home PT Goal Formulation: All assessment and education complete, DC therapy    Frequency     Barriers to discharge        Co-evaluation               AM-PAC PT "6 Clicks" Daily Activity  Outcome Measure Difficulty turning over in bed (including adjusting bedclothes, sheets and blankets)?: None Difficulty moving from lying on back to sitting on the side of the bed? : None Difficulty sitting down on and standing up from a chair with arms (e.g., wheelchair, bedside commode, etc,.)?: None Help needed moving to and from a bed to chair (including a wheelchair)?: None Help needed walking in hospital room?: None Help needed climbing 3-5 steps with a railing? : A Little 6 Click Score: 23    End of Session Equipment Utilized During Treatment: Gait belt Activity Tolerance: Patient tolerated treatment well Patient left: in chair;with call bell/phone within reach;with family/visitor present Nurse Communication: Mobility status PT Visit Diagnosis: Muscle weakness (generalized) (M62.81);Difficulty in walking, not elsewhere classified (R26.2)    Time: 1443-1500 PT Time Calculation (min) (ACUTE ONLY): 17 min   Charges:   PT Evaluation $PT Eval Low Complexity: 1 Low     PT G CodesWray Kearns, PT, DPT [phone]    Marguarite Arbour A Benjy Kana 11/20/2016, 3:52 PM

## 2016-11-20 NOTE — Progress Notes (Signed)
EEG completed; results pending.    

## 2016-11-20 NOTE — Progress Notes (Signed)
Subjective: Patient found laying in bed in NAD. Wife at bedside. EEG in progress. Patient denies any seizure activity since admission. Complaining of slight H/A this AM without photophobia or aura.  Exam: Vitals:   11/19/16 2216 11/20/16 0652  BP: 132/87 (!) 133/92  Pulse: 87 83  Resp: 18 20  Temp: 98.5 F (36.9 C) 98.3 F (36.8 C)  SpO2: 97% 96%   Gen: In bed, NAD Resp: non-labored breathing, no acute distress Abd: soft, nt  MS: Awake, alert, oriented x 3. Appropriate thought content CB:SWHQPR. Mild Left facial droop, baseline finding Motor: 5/5 throughout Sensory:intact symmetrically to light touch DTR:2+ UE/LE's  Pertinent Labs and Imaging: CT-scan of the brain shows no acute changes. MRI Brain - Normal MRI. No acute findings EEG - in progress  Impression: Syncopal convulsions vs New onset seizures Evaluation for seizures - in progress Headache  Recommendations: Load with IV Keppra if any further seizure activity documented Continue Seizure and Aspiration precautions EEG in progress Continue Ativan PRN for seizure activity No need for antiepileptics for now Tylenol PRN for H/A  Will continue to follow  Thomas Mcclain. ANP-C [facility] 11/20/2016, 10:27 AM

## 2016-11-20 NOTE — Progress Notes (Signed)
Patient is awake, alert, interactive and appropriate.  No further spells.  EEG is normal, MRI is normal.  From the description, I do continue to have concern about seizure.  I discussed options with the patient including starting a medication for seizure, versus watching and waiting.  Given that there remains some question about the diagnosis of seizure versus syncope, I think it is reasonable to wait.  The patient opted for that approach.  If he were to have further spells, I would start antiepileptic therapy at that time.  Patient is unable to drive, operate heavy machinery, perform activities at heights or participate in water activities until release by outpatient physician. This was discussed with the patient who expressed understanding.   Roland Rack, MD [facility] [phone]  If 7pm- 7am, please page neurology on call as listed in [place].

## 2016-11-21 NOTE — Discharge Summary (Signed)
[facility] Discharge Summary   Patient: Thomas Mcclain LFY:[id number]   PCP: Tresa Garter, MD DOB: 1945/02/27   Date of admission: 11/18/2016   Date of discharge: 11/20/2016     Discharge Diagnoses:  Principal Problem:   Altered mental status Active Problems:   Renal insufficiency   Admitted From: home Disposition:  home  Recommendations for Outpatient Follow-up:  1. Please follow-up with PCP in 1 week. 2. Establish care with neurology in 1 month and follow-up with them on a regular basis. 3. Do not drive any vehicle.  Follow-up Information    Tresa Garter, MD. Schedule an appointment as soon as possible for a visit in 1 week(s).   Specialty:  Internal Medicine Contact information: [place] [address] [phone]        [facility]. Schedule an appointment as soon as possible for a visit in 1 month(s).   Specialty:  Neurology Contact information: [place] [phone]         Diet recommendation: Cardiac diet  Activity: The patient is advised to gradually reintroduce usual activities.  Discharge Condition: good  Code Status: Full code  History of present illness: As per the H and P dictated on admission, "Thomas Mcclain  is a 71 y.o. male, w hx of PUD, hiatal hernia, Jerrye Bushy, Hypertension, presents with  Going to the airport to go to [facility].  Pt felt dizzy at the airport.  Then he had n/v, and his eyes rolled back and was shaking over. Lasted about 5 minutes, and had a 2nd similar episode lasting 85minutes and had some confusion after the 2nd event.  + fecal incontinence.  No tongue laceration.  pt denies cp, palp, sob, diarrhea, brbpr, black stool.      "  Hospital Course:  Summary of his active problems in the hospital is as following. 1.  Syncope. Seizure-like event. Patient had shaking followed by nausea vomiting as well as incontinence of  stool. 2 episodes so far.  No further episode here in the hospital. Presented with dehydration with lactic acid. Given IV fluids currently feeling better. MRI brain with and without contrast negative. EEG negative for any active seizures or focus of seizure.  But history is concerning for unexplained seizure-like event and therefore that patient was recommended not to drive any vehicle. Echocardiogram shows no evidence of acute wall motion abnormalities, valvular structures appears normal as well EF normal.. No significant event on telemetry Neurology consultation appreciated.  2.  Acute kidney injury. Resolution with IV fluid.  3.  HTN. Resume blood pressure medication  4.  Hiatal hernia. Peptic ulcer disease. Continue to monitor.  All other chronic medical condition were stable during the hospitalization.  Patient was seen by physical therapy, who recommended no PT follow up needed. On the day of the discharge the patient's vitals were stable, and no other acute medical condition were reported by patient. the patient was felt safe to be discharge at home with family3.  Procedures and Results:  Echocardiogram   EEG   Consultations:  Neurology  DISCHARGE MEDICATION: Discharge Medication List as of 11/20/2016  6:26 PM    CONTINUE these medications which have NOT CHANGED   Details  aspirin-acetaminophen-caffeine (EXCEDRIN MIGRAINE) 250-250-65 MG tablet Take 1 tablet every 6 (six) hours as needed by mouth for migraine., Historical Med    ferrous sulfate 325 (65 FE) MG tablet Take 325 mg daily by mouth., Historical Med  lisinopril-hydrochlorothiazide (PRINZIDE,ZESTORETIC) 20-25 MG per tablet Take 1 tablet by mouth daily., Historical Med    loratadine-pseudoephedrine (CLARITIN-D 12-HOUR) 5-120 MG per tablet Take 1 tablet by mouth as needed., Historical Med    vitamin B-12 (CYANOCOBALAMIN) 1000 MCG tablet Take 1,000 mcg daily by mouth., Historical Med    benzonatate  (TESSALON) 100 MG capsule Take 1 capsule (100 mg total) by mouth 2 (two) times daily as needed for cough., Starting Tue 10/22/2013, Normal    ipratropium (ATROVENT) 0.03 % nasal spray Place 2 sprays into both nostrils 2 (two) times daily., Starting Tue 10/22/2013, Normal       No Known Allergies Discharge Instructions    Ambulatory referral to Neurology   Complete by:  As directed    An appointment is requested in approximately: 4 weeks   Diet - low sodium heart healthy   Complete by:  As directed    Discharge instructions   Complete by:  As directed    It is important that you read following instructions as well as go over your medication list with RN to help you understand your care after this hospitalization.  Discharge Instructions:   Please follow-up with PCP in one week  Please request your primary care physician to go over all Hospital Tests and Procedure/Radiological results at the follow up,  Please get all Hospital records sent to your PCP by signing hospital release before you go home.   Do not drive, operating heavy machinery, perform activities at heights, swimming or participation in water activities or provide baby sitting services until you have been seen by Primary Care Physician or a Neurologist and advised to do so again. Do not take more than prescribed Pain, Sleep and Anxiety Medications. You were cared for by a hospitalist during your hospital stay. If you have any questions about your discharge medications or the care you received while you were in the hospital after you are discharged, you can call the unit and ask to speak with the hospitalist on call if the hospitalist that took care of you is not available.  Once you are discharged, your primary care physician will handle any further medical issues. Please note that NO REFILLS for any discharge medications will be authorized once you are discharged, as it is imperative that you return to your primary care  physician (or establish a relationship with a primary care physician if you do not have one) for your aftercare needs so that they can reassess your need for medications and monitor your lab values. You Must read complete instructions/literature along with all the possible adverse reactions/side effects for all the Medicines you take and that have been prescribed to you. Take any new Medicines after you have completely understood and accept all the possible adverse reactions/side effects. Wear Seat belts while driving. If you have smoked or chewed Tobacco in the last 2 yrs please stop smoking and/or stop any Recreational drug use.   Driving Restrictions   Complete by:  As directed    Do not drive.   Increase activity slowly   Complete by:  As directed      Discharge Exam: Filed Weights   11/18/16 2304 11/18/16 2350 11/20/16 0651  Weight: 86.2 kg (190 lb) 83.5 kg (184 lb 1.6 oz) 83.3 kg (183 lb 11.2 oz)   Vitals:   11/20/16 1551 11/20/16 1552  BP: (!) 151/109 (!) 151/97  Pulse: 98 98  Resp:    Temp:    SpO2: 100% 99%  General: Appear in no distress, no Rash; Oral Mucosa moist. Cardiovascular: S1 and S2 Present, no Murmur, no JVD Respiratory: Bilateral Air entry present and Clear to Auscultation, no Crackles, no wheezes Abdomen: Bowel Sound present, Soft and no tenderness Extremities: no Pedal edema, n calf tenderness Neurology: Grossly no focal neuro deficit.  The results of significant diagnostics from this hospitalization (including imaging, microbiology, ancillary and laboratory) are listed below for reference.    Significant Diagnostic Studies: Ct Head Wo Contrast  Result Date: 11/18/2016 CLINICAL DATA:  Syncopal episode, vomiting at airport today. History of hypertension. EXAM: CT HEAD WITHOUT CONTRAST TECHNIQUE: Contiguous axial images were obtained from the base of the skull through the vertex without intravenous contrast. COMPARISON:  None. FINDINGS: BRAIN: No  intraparenchymal hemorrhage, mass effect nor midline shift. The ventricles and sulci are upper limits of normal for age. Cavum septum pellucidum, normal variant. Patchy supratentorial white matter hypodensities less than expected for patient's age, though non-specific are most compatible with chronic small vessel ischemic disease. No acute large vascular territory infarcts. No abnormal extra-axial fluid collections. Basal cisterns are patent. VASCULAR: Mild calcific atherosclerosis of the carotid siphons. SKULL: No skull fracture. Posterior arch of C1 is developmentally the at fused. No significant scalp soft tissue swelling. SINUSES/ORBITS: Mild lobulated paranasal sinus mucosal thickening without air-fluid levels. Mastoid air cells are well aerated. The included ocular globes and orbital contents are non-suspicious. LEFT optic disc drusen. OTHER: None. IMPRESSION: 1. No acute intracranial process. 2. Borderline parenchymal brain volume loss for age. Mild chronic small vessel ischemic disease. Electronically Signed   By: Elon Alas M.D.   On: 11/18/2016 18:06   Ct Angio Chest Pe W And/or Wo Contrast  Result Date: 11/18/2016 CLINICAL DATA:  Chest pain today with a syncopal episode and vomiting. Elevated D-dimer. EXAM: CT ANGIOGRAPHY CHEST WITH CONTRAST TECHNIQUE: Multidetector CT imaging of the chest was performed using the standard protocol during bolus administration of intravenous contrast. Multiplanar CT image reconstructions and MIPs were obtained to evaluate the vascular anatomy. CONTRAST:  100 cc Isovue 370 COMPARISON:  Portable chest obtained earlier today. FINDINGS: Cardiovascular: Normally opacified pulmonary arteries with no pulmonary arterial filling defects. Borderline enlarged heart. Mild atheromatous arterial calcifications including the thoracic aorta. Mediastinum/Nodes: Small to moderate-sized hiatal hernia with a paraesophageal component. No enlarged lymph nodes. Unremarkable thyroid  gland. Lungs/Pleura: Minimal bilateral lower lobe atelectasis. No pleural fluid. Upper Abdomen: Stable mildly elevated left hemidiaphragm. Upper pole left renal cyst. Musculoskeletal: Mild thoracic and lower cervical spine degenerative changes. Multiple bilateral rib bone islands. Review of the MIP images confirms the above findings. IMPRESSION: 1. No pulmonary emboli or acute abnormality. 2. Small to moderate-sized hiatal hernia with a paraesophageal component. 3. Minimal bibasilar atelectasis. 4. Minimal aortic atherosclerosis. Aortic Atherosclerosis (ICD10-I70.0). Electronically Signed   By: Claudie Revering M.D.   On: 11/18/2016 18:08   Mr Jeri Cos XB Contrast  Result Date: 11/19/2016 CLINICAL DATA:  Altered level of consciousness, unexplained. EXAM: MRI HEAD WITHOUT AND WITH CONTRAST TECHNIQUE: Multiplanar, multiecho pulse sequences of the brain and surrounding structures were obtained without and with intravenous contrast. CONTRAST:  47mL MULTIHANCE GADOBENATE DIMEGLUMINE 529 MG/ML IV SOLN COMPARISON:  CT head without contrast 11/18/2016 FINDINGS: Brain: The diffusion-weighted images demonstrate no acute or subacute infarction. Mild atrophy and white matter changes are present bilaterally. No acute hemorrhage or mass lesion is present. Cavum septum pellucidum is noted. Ventricles are normal in size. The internal auditory canals are normal. Brainstem and cerebellum are within normal limits.  No significant extra-axial fluid collection is present. The postcontrast images demonstrate no pathologic enhancement. Vascular: Flow is present in the major intracranial arteries. Skull and upper cervical spine: The skullbase is within normal limits. The craniocervical junction is normal. Midline sagittal structures are unremarkable. Sinuses/Orbits: Mild mucosal thickening is present in the left maxillary sinus and scattered ethmoid air cells. No fluid levels are present. The mastoid air cells are clear. The globes and  orbits are within normal limits bilaterally. IMPRESSION: 1. Normal MRI of the brain for age. 2. Mild sinus disease. Electronically Signed   By: San Morelle M.D.   On: 11/19/2016 13:05   Dg Chest Portable 1 View  Result Date: 11/18/2016 CLINICAL DATA:  Syncope EXAM: PORTABLE CHEST 1 VIEW COMPARISON:  None FINDINGS: No edema or consolidation. Heart is borderline enlarged with pulmonary vascularity within normal limits. No adenopathy. No pneumothorax. No bone lesions. IMPRESSION: Heart borderline enlarged.  No edema or consolidation. Electronically Signed   By: Lowella Grip III M.D.   On: 11/18/2016 14:40    Microbiology: No results found for this or any previous visit (from the past 240 hour(s)).   Labs: CBC: Recent Labs  Lab 11/18/16 1413 11/19/16 0503  WBC 11.3* 6.6  HGB 14.2 12.6*  HCT 42.9 37.5*  MCV 81.9 81.0  PLT 177 048   Basic Metabolic Panel: Recent Labs  Lab 11/18/16 1413 11/19/16 0503  NA 134* 137  K 4.4 4.5  CL 105 107  CO2 23 23  GLUCOSE 139* 86  BUN 25* 18  CREATININE 1.56* 1.13  CALCIUM 8.5* 8.7*   Liver Function Tests: Recent Labs  Lab 11/18/16 1413 11/19/16 0503  AST 19 17  ALT 15* 13*  ALKPHOS 98 79  BILITOT 0.6 0.7  PROT 6.5 5.8*  ALBUMIN 3.6 3.2*   No results for input(s): LIPASE, AMYLASE in the last 168 hours. No results for input(s): AMMONIA in the last 168 hours. Cardiac Enzymes: Recent Labs  Lab 11/18/16 2342 11/19/16 0503 11/19/16 1208  TROPONINI <0.03 <0.03 <0.03   BNP (last 3 results) No results for input(s): BNP in the last 8760 hours. CBG: No results for input(s): GLUCAP in the last 168 hours. Time spent: 35 minutes  Signed:  Berle Mull  [facility] 11/20/2016 , 4:44 PM

## 2017-01-09 ENCOUNTER — Encounter (INDEPENDENT_AMBULATORY_CARE_PROVIDER_SITE_OTHER): Payer: Self-pay

## 2017-01-09 ENCOUNTER — Encounter: Payer: Self-pay | Admitting: Neurology

## 2017-01-09 ENCOUNTER — Ambulatory Visit (INDEPENDENT_AMBULATORY_CARE_PROVIDER_SITE_OTHER): Payer: BC Managed Care – PPO | Admitting: Neurology

## 2017-01-09 ENCOUNTER — Other Ambulatory Visit: Payer: Self-pay

## 2017-01-09 VITALS — BP 140/84 | HR 85 | Resp 16 | Ht 72.0 in | Wt 191.0 lb

## 2017-01-09 DIAGNOSIS — R55 Syncope and collapse: Secondary | ICD-10-CM

## 2017-01-09 DIAGNOSIS — K52839 Microscopic colitis, unspecified: Secondary | ICD-10-CM | POA: Diagnosis not present

## 2017-01-09 DIAGNOSIS — D509 Iron deficiency anemia, unspecified: Secondary | ICD-10-CM | POA: Diagnosis not present

## 2017-01-09 DIAGNOSIS — I10 Essential (primary) hypertension: Secondary | ICD-10-CM | POA: Diagnosis not present

## 2017-01-09 NOTE — Progress Notes (Signed)
[facility]  PATIENT: Thomas Mcclain DOB: 27-Apr-1945  REFERRING DOCTOR OR PCP:  Dr. Doreene Burke SOURCE: patient, notes from ED, lab reports  _________________________________   HISTORICAL  CHIEF COMPLAINT:  Chief Complaint  Patient presents with  . Loss of Consciousness    Syncopal episode 11/18/16 while sitting down. Believes he was dehydrated at the time, due to chronic diarrhea, minimal fluid intake.  Seen at [facility] ER where stroke, MI were r/o.  Denies dizziness/fim     HISTORY OF PRESENT ILLNESS:  I had the pleasure seeing you patient, [name], at [facility] for neurologic consultation regarding his recent loss of consciousness.  He is a 72 year old man who had an episode of syncope 11/18/2016.    He notes that he may been dehydrated due to chronic diarrhea and reduce fluid intake at that time.    His stomach was upset.  [place]    He went to the bathroom and had diarrhea.   He went back to the gate and he was sitting down.    He felt lightheaded and then passes out.  His wife noted some jerking but not definite generlized tonic clonic activity.  He did not bite his tongue but had bowel incontinence.   He had LOC x 3 -4 minutes.   After gaining consciousness, he very quickly felt his mind was back to normal.  However, he had chills and then had vomiting x 1.    Of note, he notes taking pain pills that morning also.    Also, of note he had some cold chills a few days earlier but did not have lightheadedness.   He went to the [facility] Emergency room and studies were performed to rule out stroke and myocardial infarction.  MRI of and CT scan were normal for age showing some mild sinus disease. Labwork 11/19/2016 showed mild anemia with a hemoglobin of 12.6 (widened RDW and low normal MCV) and mildly low protein and albumin and sodium.   D-dimer was mildly elevated at 1.18 (less than 0.5 normal) and lactic acid was minimally  elevated.  CT angiogram did not show any evidence of pulmonary embolus though a hiatal hernia was noted.   I personally reviewed the CT and MRI of the brain.    They show mild generalized cortical atrophy and mild chronic vessel ischemic changes but no acute findings.   A carotid Doppler study 11/20/2016 was normal for age. An echocardiogram 11/20/2016 showed a LVEF percent of 01-09% L3 diastolic function cannot be evaluated. There was mild aortic regurgitation. The aortic valve was mildly thickened with sclerosis though there was no stenosis. Mitral valve showed trivial regurgitation.  He has never had a similar event in the past.    He has benign essential hypertension and iron deficiency anemia. He has had chronic diarrhea and had a colonoscopy at [place] 10/24/16.   According to notes I was able to access he has microscopic colitis.   Dr. Dorrene German had been unable to reach him with the results.     REVIEW OF SYSTEMS: Constitutional: No fevers, chills, sweats, or change in appetite Eyes: No visual changes, double vision, eye pain Ear, nose and throat: No hearing loss, ear pain, nasal congestion, sore throat Cardiovascular: No chest pain, palpitations Respiratory: No shortness of breath at rest or with exertion.   No wheezes GastrointestinaI: as above Genitourinary: No dysuria, urinary retention or frequency.  No nocturia. Musculoskeletal: No neck pain,  back pain Integumentary: No rash, pruritus, skin lesions Neurological: as above Psychiatric: No depression at this time.  No anxiety Endocrine: No palpitations, diaphoresis, change in appetite, change in weigh or increased thirst Hematologic/Lymphatic: No anemia, purpura, petechiae. Allergic/Immunologic: No itchy/runny eyes, nasal congestion, recent allergic reactions, rashes  ALLERGIES: No Known Allergies  HOME MEDICATIONS:  Current Outpatient Medications:  .  aspirin-acetaminophen-caffeine (EXCEDRIN MIGRAINE) 250-250-65 MG tablet,  Take 1 tablet every 6 (six) hours as needed by mouth for migraine., Disp: , Rfl:  .  benzonatate (TESSALON) 100 MG capsule, Take 1 capsule (100 mg total) by mouth 2 (two) times daily as needed for cough., Disp: 20 capsule, Rfl: 0 .  ferrous sulfate 325 (65 FE) MG tablet, Take 325 mg daily by mouth., Disp: , Rfl:  .  ipratropium (ATROVENT) 0.03 % nasal spray, Place 2 sprays into both nostrils 2 (two) times daily., Disp: 30 mL, Rfl: 0 .  lisinopril-hydrochlorothiazide (PRINZIDE,ZESTORETIC) 20-25 MG per tablet, Take 1 tablet by mouth daily., Disp: , Rfl:  .  loratadine-pseudoephedrine (CLARITIN-D 12-HOUR) 5-120 MG per tablet, Take 1 tablet by mouth as needed., Disp: , Rfl:  .  vitamin B-12 (CYANOCOBALAMIN) 1000 MCG tablet, Take 1,000 mcg daily by mouth., Disp: , Rfl:   PAST MEDICAL HISTORY: Past Medical History:  Diagnosis Date  . Allergic rhinitis   . BPH (benign prostatic hyperplasia)   . Cancer ([place])   . Frequency of urination   . Hypertension   . Iron deficiency   . Nocturia   . Renal insufficiency 11/18/2016    PAST SURGICAL HISTORY: Past Surgical History:  Procedure Laterality Date  . ABDOMINAL SURGERY    . ESOPHAGOGASTRODUODENOSCOPY N/A 05/23/2013   Procedure: ESOPHAGOGASTRODUODENOSCOPY (EGD);  Surgeon: Ladene Artist, MD;  Location: [facility] ENDOSCOPY;  Service: Endoscopy;  Laterality: N/A;  . LEFT ANKLE DEBRIDEMENT AND FUSION  2010  . TRANSURETHRAL RESECTION OF PROSTATE  01/31/2011   Procedure: TRANSURETHRAL RESECTION OF THE PROSTATE WITH GYRUS INSTRUMENTS;  Surgeon: Bernestine Amass, MD;  Location: [facility];  Service: Urology;  Laterality: N/A;  GYRUS OWER    FAMILY HISTORY: Family History  Problem Relation Age of Onset  . Diabetes Mother     SOCIAL HISTORY:  Social History   Socioeconomic History  . Marital status: Married    Spouse name: Not on file  . Number of children: Not on file  . Years of education: Not on file  . Highest education level: Not  on file  Social Needs  . Financial resource strain: Not on file  . Food insecurity - worry: Not on file  . Food insecurity - inability: Not on file  . Transportation needs - medical: Not on file  . Transportation needs - non-medical: Not on file  Occupational History  . Not on file  Tobacco Use  . Smoking status: Former Smoker    Types: Cigarettes    Last attempt to quit: 01/24/1977    Years since quitting: 39.9  . Smokeless tobacco: Never Used  Substance and Sexual Activity  . Alcohol use: Yes    Alcohol/week: 8.4 oz    Types: 14 Cans of beer per week  . Drug use: No  . Sexual activity: Not on file  Other Topics Concern  . Not on file  Social History Narrative  . Not on file     PHYSICAL EXAM  Vitals:   01/09/17 1033  BP: 140/84  Pulse: 85  Resp: 16  Weight: 191 lb (86.6 kg)  Height: 6' (1.829 m)    Body mass index is 25.9 kg/m.   Orthostatic VS for the past 24 hrs:  BP- Lying Pulse- Lying BP- Sitting Pulse- Sitting BP- Standing at 0 minutes Pulse- Standing at 0 minutes  01/09/17 1036 140/84 85 (!) 138/91 94 (!) 158/94 94      General: The patient is well-developed and well-nourished and in no acute distress.  Pharynx is Mallampati 1  Eyes:  Funduscopic exam shows normal optic discs and retinal vessels.  Neck: The neck is supple, no carotid bruits are noted.  The neck is nontender.  Cardiovascular: The heart has a regular rate and rhythm with a normal S1 and S2. There were no murmurs, gallops or rubs. Lungs are clear to auscultation.  Skin: Extremities are without significant edema.  Musculoskeletal:  Back is nontender  Neurologic Exam  Mental status: The patient is alert and oriented x 3 at the time of the examination. The patient has apparent normal recent and remote memory, with an apparently normal attention span and concentration ability.   Speech is normal.  Cranial nerves: Extraocular movements are full. Pupils are equal, round, and reactive to  light and accomodation.  Visual fields are full.  Facial symmetry is present. There is good facial sensation to soft touch bilaterally.Facial strength is normal.  Trapezius and sternocleidomastoid strength is normal. No dysarthria is noted.  The tongue is midline, and the patient has symmetric elevation of the soft palate. No obvious hearing deficits are noted.  Motor:  Muscle bulk is normal.   Tone is normal. Strength is  5 / 5 in all 4 extremities.   Sensory: Sensory testing is intact to pinprick, soft touch and vibration sensation in all 4 extremities.  Coordination: Cerebellar testing reveals good finger-nose-finger and heel-to-shin bilaterally.  Gait and station: Station is normal.   Gait is normal. Tandem gait is normal. Romberg is negative.   Reflexes: Deep tendon reflexes are symmetric and normal bilaterally.   Plantar responses are flexor.    DIAGNOSTIC DATA (LABS, IMAGING, TESTING) - I reviewed patient records, labs, notes, testing and imaging myself where available.  Lab Results  Component Value Date   WBC 6.6 11/19/2016   HGB 12.6 (L) 11/19/2016   HCT 37.5 (L) 11/19/2016   MCV 81.0 11/19/2016   PLT 168 11/19/2016      Component Value Date/Time   NA 137 11/19/2016 0503   K 4.5 11/19/2016 0503   CL 107 11/19/2016 0503   CO2 23 11/19/2016 0503   GLUCOSE 86 11/19/2016 0503   BUN 18 11/19/2016 0503   CREATININE 1.13 11/19/2016 0503   CALCIUM 8.7 (L) 11/19/2016 0503   PROT 5.8 (L) 11/19/2016 0503   ALBUMIN 3.2 (L) 11/19/2016 0503   AST 17 11/19/2016 0503   ALT 13 (L) 11/19/2016 0503   ALKPHOS 79 11/19/2016 0503   BILITOT 0.7 11/19/2016 0503   GFRNONAA >60 11/19/2016 0503   GFRAA >60 11/19/2016 0503   Lab Results  Component Value Date   CHOL 158 10/17/2012   HDL 35 (L) 10/17/2012   LDLCALC 98 10/17/2012   TRIG 127 10/17/2012   CHOLHDL 4.5 10/17/2012   Lab Results  Component Value Date   HGBA1C 5.2 10/17/2012   No results found for: VITAMINB12 Lab Results    Component Value Date   TSH 0.794 11/19/2016       ASSESSMENT AND PLAN  Syncope, unspecified syncope type  Essential hypertension, benign  Iron deficiency anemia, unspecified iron deficiency anemia  type  Microscopic colitis, unspecified microscopic colitis type   In summary, Mr. Blackwell is a 72 year old man with an episode of syncope that occurred on the day where he had multiple episodes of diarrhea and reduced by mouth intake. Most likely, the episode of syncope was related to volume depletion that day. He might have had a viral syndrome several days earlier.  The jerking in the arms may have been myoclonus while he remained in a seated position with loss of consciousness. However, to check an EEG to make sure that he does not have any epileptiform activity. If present, I would recommend starting an antiepileptic agent and I will have him come back to see me.    If normal, no further follow-up will be needed but I would want him to call back if he has new or worsening symptoms or recurrent syncope.      He also was recently diagnosed with microscopic colitis. We have sent in a request to get him back in to see his gastroenterologist, Dr. Dorrene German, in [facility].  He would prefer to see me as needed if new or worsening symptoms.  Thank you for asking me to see Mr. Monje. Please let me know if I can be of further assistance with him or other patients in the future.    A. Felecia Shelling, MD, [facility] 09/09/5298, 51:10 AM Certified in Neurology, Clinical Neurophysiology, Sleep Medicine, Pain Medicine and Neuroimaging  [facility] [address], [place], [place] [address] [phone]

## 2017-01-11 ENCOUNTER — Ambulatory Visit (INDEPENDENT_AMBULATORY_CARE_PROVIDER_SITE_OTHER): Payer: BC Managed Care – PPO | Admitting: Neurology

## 2017-01-11 DIAGNOSIS — R55 Syncope and collapse: Secondary | ICD-10-CM | POA: Diagnosis not present

## 2017-01-11 NOTE — Progress Notes (Signed)
   [facility]  EEG (ELECTROENCEPHALOGRAM) REPORT   STUDY DATE: 01/11/2017 PATIENT NAME: Thomas Mcclain DOB: September 01, 1945 MRN: [id number]  ORDERING CLINICIAN: Simran Bomkamp A. Felecia Shelling, MD. PhD  TECHNOLOGIST: Laretta Alstrom TECHNIQUE: Electroencephalogram was recorded utilizing standard 10-20 system of lead placement and reformatted into average and bipolar montages.  RECORDING TIME: 22 minutes  CLINICAL INFORMATION: 72 year old man with an episode of loss of consciousness associated with some jerking  FINDINGS: A digital EEG was performed while the patient was awake and drowsy. While awake and most alert there was a 10.5 hz posterior dominant rhythm. Voltages and frequencies were symmetric.  There were no focal, lateralizing, epileptiform activity or seizures seen.  Photic stimulation had a normal driving response. Hyperventilation and recovery did not change the underlying rhythms.    The patient remained awake throughout the recording. No significant drowsiness was noted. There was no sleep recorded.  IMPRESSION: This is a normal EEG with the patient was awake.   INTERPRETING PHYSICIAN:   Karter Hellmer A. Felecia Shelling, MD, PhD, [facility] Certified in Neurology, Clinical Neurophysiology, Sleep Medicine, Pain Medicine and Neuroimaging  [facility] [address], [place], [place] [address] [phone]

## 2017-02-01 ENCOUNTER — Telehealth: Payer: Self-pay | Admitting: Neurology

## 2017-02-01 NOTE — Telephone Encounter (Signed)
Pt is asking for a call to know if he has been cleared to return to work.  Please call

## 2017-02-02 NOTE — Telephone Encounter (Signed)
Patient is aware and letter will be left at the front desk. Patient is aware that we close at noon tomorrow.

## 2017-02-02 NOTE — Telephone Encounter (Signed)
Pt has called back re: request from yesterday to know if Dr Felecia Shelling is giving clearance for him to return to work.  Pt  States if Dr Felecia Shelling gives the okay pt is asking if a note could be put up front and he will pick it up.  Please call

## 2017-02-02 NOTE — Telephone Encounter (Signed)
Per Dr. Felecia Shelling, he is ok to return to work.

## 2017-07-01 ENCOUNTER — Emergency Department (HOSPITAL_COMMUNITY)
Admission: EM | Admit: 2017-07-01 | Discharge: 2017-07-02 | Disposition: A | Discharge status: Home / Self Care | Payer: BC Managed Care – PPO | Attending: Emergency Medicine | Admitting: Emergency Medicine

## 2017-07-01 ENCOUNTER — Encounter (HOSPITAL_COMMUNITY): Payer: Self-pay

## 2017-07-01 ENCOUNTER — Other Ambulatory Visit: Payer: Self-pay

## 2017-07-01 DIAGNOSIS — I10 Essential (primary) hypertension: Secondary | ICD-10-CM | POA: Diagnosis not present

## 2017-07-01 DIAGNOSIS — R55 Syncope and collapse: Secondary | ICD-10-CM

## 2017-07-01 DIAGNOSIS — Z87891 Personal history of nicotine dependence: Secondary | ICD-10-CM | POA: Diagnosis not present

## 2017-07-01 DIAGNOSIS — Z859 Personal history of malignant neoplasm, unspecified: Secondary | ICD-10-CM | POA: Insufficient documentation

## 2017-07-01 DIAGNOSIS — Z79899 Other long term (current) drug therapy: Secondary | ICD-10-CM | POA: Diagnosis not present

## 2017-07-01 LAB — BASIC METABOLIC PANEL
ANION GAP: 5 (ref 5–15)
BUN: 19 mg/dL (ref 8–23)
CO2: 26 mmol/L (ref 22–32)
Calcium: 9 mg/dL (ref 8.9–10.3)
Chloride: 109 mmol/L (ref 98–111)
Creatinine, Ser: 1.22 mg/dL (ref 0.61–1.24)
GFR calc non Af Amer: 58 mL/min — ABNORMAL LOW (ref >60)
GLUCOSE: 105 mg/dL — AB (ref 70–99)
Potassium: 4.5 mmol/L (ref 3.5–5.1)
Sodium: 140 mmol/L (ref 135–145)

## 2017-07-01 LAB — CBC
HEMATOCRIT: 43.9 % (ref 39.0–52.0)
HEMOGLOBIN: 15.1 g/dL (ref 13.0–17.0)
MCH: 29.1 pg (ref 26.0–34.0)
MCHC: 34.4 g/dL (ref 30.0–36.0)
MCV: 84.6 fL (ref 78.0–100.0)
Platelets: 203 10*3/uL (ref 150–400)
RBC: 5.19 MIL/uL (ref 4.22–5.81)
RDW: 15.3 % (ref 11.5–15.5)
WBC: 9.5 10*3/uL (ref 4.0–10.5)

## 2017-07-01 LAB — URINALYSIS, ROUTINE W REFLEX MICROSCOPIC
BILIRUBIN URINE: NEGATIVE
Glucose, UA: NEGATIVE mg/dL
Hgb urine dipstick: NEGATIVE
Ketones, ur: NEGATIVE mg/dL
Leukocytes, UA: NEGATIVE
NITRITE: NEGATIVE
PH: 5 (ref 5.0–8.0)
Protein, ur: NEGATIVE mg/dL
Specific Gravity, Urine: 1.027 (ref 1.005–1.030)

## 2017-07-01 LAB — CBG MONITORING, ED: GLUCOSE-CAPILLARY: 90 mg/dL (ref 70–99)

## 2017-07-01 NOTE — ED Provider Notes (Signed)
[facility] DEPT Provider Note  CSN: [id number] Arrival date & time: 07/01/17 2207  Chief Complaint(s) Near Syncope  HPI Thomas Mcclain is a 72 y.o. male   The history is provided by the patient and a relative.  Near Syncope  This is a new problem. The current episode started 3 to 5 hours ago. Episode frequency: once. The problem has been resolved. Pertinent negatives include no chest pain, no abdominal pain, no headaches and no shortness of breath. Nothing aggravates the symptoms. The symptoms are relieved by rest. He has tried nothing for the symptoms.   Patient [place] several alcoholic drinks.  States that he only drank one bottle of water throughout the day.  States that the episode occurred immediately after a mechanical fall causing him to land on his buttocks onto the bleachers of the racetrack.  States that he felt severe pain upon falling.  States that he became lightheaded and had chills.  There were witnesses that noted the patient became pale and minimally responsive.  This lasted for less than 1 minute.  No complete loss of consciousness.  Patient endorsed coccyx pain following the incident which has now resolved.  He has been able to ambulate without complication since.  States that he has not had anything to drink since.  Denies any recent fevers or infections.  No cough or chest pain.  No nausea or vomiting.  No abdominal pain.  No diarrhea.  No melena or hematochezia.  Denies any other physical complaints at this time.  Patient denies any other trauma including head.  No neck pain, back pain.  No lower extremity weakness.  Past Medical History Past Medical History:  Diagnosis Date  . Allergic rhinitis   . BPH (benign prostatic hyperplasia)   . Cancer ([place])   . Chronic diarrhea   . Frequency of urination   . Hypertension   . Iron deficiency   . Nocturia   . Renal  insufficiency 11/18/2016   Patient Active Problem List   Diagnosis Date Noted  . Microscopic colitis 01/09/2017  . Altered mental status 11/18/2016  . Renal insufficiency 11/18/2016  . Acute esophagitis 05/24/2013  . Hiatal hernia 05/24/2013  . Upper gastrointestinal bleed 05/23/2013  . GI bleed 05/23/2013  . Acute posthemorrhagic anemia 05/23/2013  . Syncope 05/23/2013  . Essential hypertension, benign 10/17/2012  . Anemia, iron deficiency 10/17/2012  . Benign prostate hyperplasia 01/31/2011   Home Medication(s) Prior to Admission medications   Medication Sig Start Date End Date Taking? Authorizing Provider  aspirin-acetaminophen-caffeine (EXCEDRIN MIGRAINE) [phone] MG tablet Take 1 tablet every 6 (six) hours as needed by mouth for migraine.   Yes [provider]  lisinopril-hydrochlorothiazide (PRINZIDE,ZESTORETIC) 20-25 MG per tablet Take 1 tablet by mouth daily.   Yes [provider]  loratadine-pseudoephedrine (CLARITIN-D 12-HOUR) 5-120 MG per tablet Take 1 tablet by mouth as needed.   Yes [provider]  Multiple Vitamin (MULTIVITAMIN WITH MINERALS) TABS tablet Take 1 tablet by mouth daily.   Yes [provider]  vitamin B-12 (CYANOCOBALAMIN) 1000 MCG tablet Take 1,000 mcg daily by mouth.   Yes [provider]  ferrous sulfate 325 (65 FE) MG tablet Take 325 mg daily by mouth.    [provider]  Past Surgical History Past Surgical History:  Procedure Laterality Date  . ABDOMINAL SURGERY    . ESOPHAGOGASTRODUODENOSCOPY N/A 05/23/2013   Procedure: ESOPHAGOGASTRODUODENOSCOPY (EGD);  Surgeon: Ladene Artist, MD;  Location: [facility] ENDOSCOPY;  Service: Endoscopy;  Laterality: N/A;  . LEFT ANKLE DEBRIDEMENT AND FUSION  2010  . TRANSURETHRAL RESECTION OF PROSTATE  01/31/2011   Procedure: TRANSURETHRAL  RESECTION OF THE PROSTATE WITH GYRUS INSTRUMENTS;  Surgeon: Bernestine Amass, MD;  Location: [facility];  Service: Urology;  Laterality: N/A;  GYRUS OWER   Family History Family History  Problem Relation Age of Onset  . Diabetes Mother   . Kidney Stones Sister   . Memory loss Brother     Social History Social History   Tobacco Use  . Smoking status: Former Smoker    Types: Cigarettes    Last attempt to quit: 01/24/1977    Years since quitting: 40.4  . Smokeless tobacco: Never Used  Substance Use Topics  . Alcohol use: Yes    Alcohol/week: 8.4 oz    Types: 14 Cans of beer per week  . Drug use: No   Allergies Patient has no known allergies.  Review of Systems Review of Systems  Respiratory: Negative for shortness of breath.   Cardiovascular: Positive for near-syncope. Negative for chest pain.  Gastrointestinal: Negative for abdominal pain.  Neurological: Negative for headaches.   All other systems are reviewed and are negative for acute change except as noted in the HPI  Physical Exam Vital Signs  I have reviewed the triage vital signs BP (!) 124/91   Pulse 76   Temp 98.2 F (36.8 C) (Oral)   Resp (!) 22   Ht 6' (1.829 m)   Wt 86.6 kg (191 lb)   SpO2 98%   BMI 25.90 kg/m   Physical Exam  Musculoskeletal:       Right hip: He exhibits no tenderness.       Left hip: He exhibits no tenderness.       Lumbar back: He exhibits no tenderness.       Back:    ED Results and Treatments Labs (all labs ordered are listed, but only abnormal results are displayed) Labs Reviewed  BASIC METABOLIC PANEL - Abnormal; Notable for the following components:      Result Value   Glucose, Bld 105 (*)    GFR calc non Af Amer 58 (*)    All other components within normal limits  CBC  URINALYSIS, ROUTINE W REFLEX MICROSCOPIC  CBG MONITORING, ED                                                                                                                           EKG  EKG Interpretation  Date/Time:  Saturday July 01 2017 22:18:14 EDT Ventricular Rate:  63 PR Interval:    QRS Duration: 91 QT Interval:  412 QTC Calculation: 422 R Axis:   -34 Text Interpretation:  Sinus rhythm  Left axis deviation Consider anterior infarct No significant change since last tracing Confirmed by Addison Lank [phone]) on 07/01/2017 11:30:46 PM      Radiology No results found. Pertinent labs & imaging results that were available during my care of the patient were reviewed by me and considered in my medical decision making (see chart for details).  Medications Ordered in ED Medications - No data to display                                                                                                                                  Procedures Procedures  (including critical care time)  Medical Decision Making / ED Course I have reviewed the nursing notes for this encounter and the patient's prior records (if available in EHR or on provided paperwork).    Presentation is most suspicious for vasovagal in the setting of severe pain and dehydration.  No other trauma.  Patient without any tenderness to palpation on exam requiring imaging.  Labs grossly reassuring without anemia.  No significant electrolyte derangements or renal insufficiency.  EKG without acute ischemic changes, dysrhythmias, blocks, epsilon waves, [place], evidence of HOCM.  Orthostatics reassuring.  Patient provided with oral hydration which she tolerated.  Doubt cardiac etiology.  Doubt pulmonary embolism.  The patient appears reasonably screened and/or stabilized for discharge and I doubt any other medical condition or other [facility] requiring further screening, evaluation, or treatment in the ED at this time prior to discharge.  The patient is safe for discharge with strict return precautions.   Final Clinical Impression(s) / ED Diagnoses Final diagnoses:  Near syncope    Disposition:  Discharge  Condition: Good  I have discussed the results, Dx and Tx plan with the patient who expressed understanding and agree(s) with the plan. Discharge instructions discussed at great length. The patient was given strict return precautions who verbalized understanding of the instructions. No further questions at time of discharge.    ED Discharge Orders    None       Follow Up: Tresa Garter, MD [address] [place] [address] [phone]  Schedule an appointment as soon as possible for a visit  As needed     This chart was dictated using voice recognition software.  Despite best efforts to proofread,  errors can occur which can change the documentation meaning.   Fatima Blank, MD 07/02/17 [phone]

## 2017-07-01 NOTE — ED Triage Notes (Signed)
States passed out at car racing track and now alert and oriented x 3 states he wanted to go home not to come to hospital.

## 2017-07-02 NOTE — ED Notes (Signed)
Pt given PO fluids at this time for rehydration.

## 2018-07-14 ENCOUNTER — Emergency Department (HOSPITAL_COMMUNITY)
Admission: EM | Admit: 2018-07-14 | Discharge: 2018-07-14 | Disposition: A | Discharge status: Home / Self Care | Payer: Medicare Other | Attending: Emergency Medicine | Admitting: Emergency Medicine

## 2018-07-14 ENCOUNTER — Encounter (HOSPITAL_COMMUNITY): Payer: Self-pay | Admitting: *Deleted

## 2018-07-14 ENCOUNTER — Emergency Department (HOSPITAL_COMMUNITY): Payer: Medicare Other

## 2018-07-14 ENCOUNTER — Other Ambulatory Visit: Payer: Self-pay

## 2018-07-14 DIAGNOSIS — Y9389 Activity, other specified: Secondary | ICD-10-CM | POA: Insufficient documentation

## 2018-07-14 DIAGNOSIS — W19XXXA Unspecified fall, initial encounter: Secondary | ICD-10-CM | POA: Insufficient documentation

## 2018-07-14 DIAGNOSIS — I10 Essential (primary) hypertension: Secondary | ICD-10-CM | POA: Diagnosis not present

## 2018-07-14 DIAGNOSIS — Z79899 Other long term (current) drug therapy: Secondary | ICD-10-CM | POA: Diagnosis not present

## 2018-07-14 DIAGNOSIS — Z87891 Personal history of nicotine dependence: Secondary | ICD-10-CM | POA: Diagnosis not present

## 2018-07-14 DIAGNOSIS — S62102A Fracture of unspecified carpal bone, left wrist, initial encounter for closed fracture: Secondary | ICD-10-CM | POA: Insufficient documentation

## 2018-07-14 DIAGNOSIS — Z859 Personal history of malignant neoplasm, unspecified: Secondary | ICD-10-CM | POA: Diagnosis not present

## 2018-07-14 DIAGNOSIS — S022XXA Fracture of nasal bones, initial encounter for closed fracture: Secondary | ICD-10-CM | POA: Diagnosis not present

## 2018-07-14 DIAGNOSIS — Y92017 Garden or yard in single-family (private) house as the place of occurrence of the external cause: Secondary | ICD-10-CM | POA: Insufficient documentation

## 2018-07-14 DIAGNOSIS — S0990XA Unspecified injury of head, initial encounter: Secondary | ICD-10-CM | POA: Diagnosis present

## 2018-07-14 DIAGNOSIS — Y998 Other external cause status: Secondary | ICD-10-CM | POA: Insufficient documentation

## 2018-07-14 MED ORDER — BACITRACIN ZINC 500 UNIT/GM EX OINT
TOPICAL_OINTMENT | CUTANEOUS | Status: AC
  Filled 2018-07-14: qty 3.6

## 2018-07-14 MED ORDER — ACETAMINOPHEN 500 MG PO TABS
500.0000 mg | ORAL_TABLET | Freq: Once | ORAL | Status: AC
  Administered 2018-07-14: 500 mg via ORAL
  Filled 2018-07-14: qty 1

## 2018-07-14 MED ORDER — OXYCODONE HCL 5 MG PO TABS: 5.0000 mg | ORAL_TABLET | Freq: Four times a day (QID) | ORAL | 0 refills | Status: DC | PRN

## 2018-07-14 NOTE — ED Provider Notes (Signed)
[facility] DEPT Provider Note   CSN: [id number] Arrival date & time: 07/14/18  1129    History   Chief Complaint Chief Complaint  Patient presents with   Fall    HPI Kenon Delashmit is a 73 y.o. male.     HPI Patient presents after fall. Patient was working on a deck, moving forwards, when he had a mechanical event. He fell onto his knees, wrists, face. No loss of consciousness, and no subsequent confusion, disorientation, neck pain. He does, however have 10/10 pain in his face, head, left wrist He also has some pain in the knees bilaterally, though less so. No inability to ambulate, no weakness in any extremity, no medication taken for pain relief. Past Medical History:  Diagnosis Date   Allergic rhinitis    BPH (benign prostatic hyperplasia)    Cancer (HCC)    Chronic diarrhea    Frequency of urination    Hypertension    Iron deficiency    Nocturia    Renal insufficiency 11/18/2016    Patient Active Problem List   Diagnosis Date Noted   Microscopic colitis 01/09/2017   Altered mental status 11/18/2016   Renal insufficiency 11/18/2016   Acute esophagitis 05/24/2013   Hiatal hernia 05/24/2013   Upper gastrointestinal bleed 05/23/2013   GI bleed 05/23/2013   Acute posthemorrhagic anemia 05/23/2013   Syncope 05/23/2013   Essential hypertension, benign 10/17/2012   Anemia, iron deficiency 10/17/2012   Benign prostate hyperplasia 01/31/2011    Past Surgical History:  Procedure Laterality Date   ABDOMINAL SURGERY     ESOPHAGOGASTRODUODENOSCOPY N/A 05/23/2013   Procedure: ESOPHAGOGASTRODUODENOSCOPY (EGD);  Surgeon: Ladene Artist, MD;  Location: [facility] ENDOSCOPY;  Service: Endoscopy;  Laterality: N/A;   LEFT ANKLE DEBRIDEMENT AND FUSION  2010   TRANSURETHRAL RESECTION OF PROSTATE  01/31/2011   Procedure: TRANSURETHRAL RESECTION OF THE PROSTATE WITH GYRUS INSTRUMENTS;  Surgeon: Bernestine Amass, MD;  Location:  [facility];  Service: Urology;  Laterality: N/A;  GYRUS OWER        Home Medications    Prior to Admission medications   Medication Sig Start Date End Date Taking? Authorizing Provider  aspirin-acetaminophen-caffeine (EXCEDRIN MIGRAINE) [phone] MG tablet Take 1 tablet every 6 (six) hours as needed by mouth for migraine.   Yes [provider]  diphenhydrAMINE (SOMINEX) 25 MG tablet Take 25 mg by mouth at bedtime as needed for allergies or sleep.   Yes [provider]  ferrous sulfate 325 (65 FE) MG tablet Take 325 mg daily by mouth.   Yes [provider]  loratadine-pseudoephedrine (CLARITIN-D 12-HOUR) 5-120 MG per tablet Take 1 tablet by mouth as needed for allergies.    Yes [provider]  Multiple Vitamin (MULTIVITAMIN WITH MINERALS) TABS tablet Take 1 tablet by mouth daily.   Yes [provider]  Omega-3 1000 MG CAPS Take 1 capsule by mouth daily.   Yes [provider]  vitamin B-12 (CYANOCOBALAMIN) 1000 MCG tablet Take 1,000 mcg daily by mouth.   Yes [provider]    Family History Family History  Problem Relation Age of Onset   Diabetes Mother    Kidney Stones Sister    Memory loss Brother     Social History Social History   Tobacco Use   Smoking status: Former Smoker    Types: Cigarettes    Quit date: 01/24/1977    Years since quitting: 41.4   Smokeless tobacco: Never Used  Substance Use Topics  Alcohol use: Yes    Alcohol/week: 14.0 standard drinks    Types: 14 Cans of beer per week   Drug use: No     Allergies   Patient has no known allergies.   Review of Systems Review of Systems  Constitutional:       Per HPI, otherwise negative  HENT:       Per HPI, otherwise negative  Respiratory:       Per HPI, otherwise negative  Cardiovascular:       Per HPI, otherwise negative  Gastrointestinal: Negative for vomiting.  Endocrine:       Negative aside from HPI    Genitourinary:       Neg aside from HPI   Musculoskeletal:       Per HPI, otherwise negative  Skin: Positive for wound.  Neurological: Negative for syncope.     Physical Exam Updated Vital Signs BP (!) 127/95 (BP Location: Left Arm)    Pulse 77    Temp 98.1 F (36.7 C) (Oral)    Resp 16    SpO2 99%   Physical Exam Vitals signs and nursing note reviewed.  Constitutional:      General: He is not in acute distress.    Appearance: He is well-developed.  HENT:     Head: Normocephalic.   Eyes:     Conjunctiva/sclera: Conjunctivae normal.  Cardiovascular:     Rate and Rhythm: Normal rate and regular rhythm.  Pulmonary:     Effort: Pulmonary effort is normal. No respiratory distress.     Breath sounds: No stridor.  Abdominal:     General: There is no distension.     Tenderness: There is no abdominal tenderness. There is no guarding.  Musculoskeletal:       Arms:       Legs:  Skin:    General: Skin is warm and dry.  Neurological:     Mental Status: He is alert and oriented to person, place, and time.     Cranial Nerves: Cranial nerves are intact.     Motor: No weakness, tremor or atrophy.      ED Treatments / Results  Labs (all labs ordered are listed, but only abnormal results are displayed) Labs Reviewed - No data to display  EKG None  Radiology Dg Wrist Complete Left  Result Date: 07/14/2018 CLINICAL DATA:  Pain after fall EXAM: LEFT WRIST - COMPLETE 3+ VIEW COMPARISON:  None. FINDINGS: There is a fracture through the distal the radius extending into the radiocarpal joint without significant displacement. Two adjacent tiny soft tissue calcifications are seen along the dorsal aspect of the wrist, adjacent to the lunate. A foreign body is seen in the soft tissues between the first and second metacarpals. IMPRESSION: 1. There is a fracture through the distal radius without significant displacement. 2. Soft tissue calcifications along the dorsum of the wrist adjacent  to lunate are more proximally located than suspected for a triquetral fracture. These calcifications are age indeterminate but tiny avulsed fragments are not excluded. 3. High density foreign body in the soft tissues adjacent to the first metacarpal, age indeterminate. Electronically Signed   By: Dorise Bullion III M.D   On: 07/14/2018 14:44   Ct Head Wo Contrast  Result Date: 07/14/2018 CLINICAL DATA:  Fall, blunt trauma, facial injuries EXAM: CT HEAD WITHOUT CONTRAST CT MAXILLOFACIAL WITHOUT CONTRAST TECHNIQUE: Multidetector CT imaging of the head and maxillofacial structures were performed using the standard protocol without intravenous contrast. Multiplanar CT  image reconstructions of the maxillofacial structures were also generated. COMPARISON:  11/18/2016 FINDINGS: CT HEAD FINDINGS Brain: No evidence of acute infarction, hemorrhage, hydrocephalus, extra-axial collection or mass lesion/mass effect. Periventricular white matter hypodensity. Incidental note of cavum septum pellucidum et vergae variant of the lateral ventricles. Vascular: No hyperdense vessel or unexpected calcification. Skull: Normal. Negative for fracture or focal lesion. Other: None. CT MAXILLOFACIAL FINDINGS Osseous: Mildly displaced fractures of the bilateral nasal bones and anterior nasal septum (series 1, image 5). Orbits: Negative. No traumatic or inflammatory finding. Sinuses: Clear. Soft tissues: Soft tissue edema of the nose and forehead. IMPRESSION: 1. No acute intracranial pathology. Small-vessel white matter disease. 2. Mildly displaced fractures of the bilateral nasal bones and anterior nasal septum (series 1, image 5). Electronically Signed   By: Eddie Candle M.D.   On: 07/14/2018 14:51   Ct Maxillofacial Wo Cm  Result Date: 07/14/2018 CLINICAL DATA:  Fall, blunt trauma, facial injuries EXAM: CT HEAD WITHOUT CONTRAST CT MAXILLOFACIAL WITHOUT CONTRAST TECHNIQUE: Multidetector CT imaging of the head and maxillofacial  structures were performed using the standard protocol without intravenous contrast. Multiplanar CT image reconstructions of the maxillofacial structures were also generated. COMPARISON:  11/18/2016 FINDINGS: CT HEAD FINDINGS Brain: No evidence of acute infarction, hemorrhage, hydrocephalus, extra-axial collection or mass lesion/mass effect. Periventricular white matter hypodensity. Incidental note of cavum septum pellucidum et vergae variant of the lateral ventricles. Vascular: No hyperdense vessel or unexpected calcification. Skull: Normal. Negative for fracture or focal lesion. Other: None. CT MAXILLOFACIAL FINDINGS Osseous: Mildly displaced fractures of the bilateral nasal bones and anterior nasal septum (series 1, image 5). Orbits: Negative. No traumatic or inflammatory finding. Sinuses: Clear. Soft tissues: Soft tissue edema of the nose and forehead. IMPRESSION: 1. No acute intracranial pathology. Small-vessel white matter disease. 2. Mildly displaced fractures of the bilateral nasal bones and anterior nasal septum (series 1, image 5). Electronically Signed   By: Eddie Candle M.D.   On: 07/14/2018 14:51    Procedures Procedures (including critical care time)  Medications Ordered in ED Medications  bacitracin 500 UNIT/GM ointment (has no administration in time range)  acetaminophen (TYLENOL) tablet 500 mg (500 mg Oral Given 07/14/18 1446)     Initial Impression / Assessment and Plan / ED Course  I have reviewed the triage vital signs and the nursing notes.  Pertinent labs & imaging results that were available during my care of the patient were reviewed by me and considered in my medical decision making (see chart for details).  Elderly male presents after mechanical fall with pain in multiple areas including his head, face, wrist. Patient is awake, alert, neurologically intact, hemodynamically stable. Patient does have knee pain, but no evidence for fracture clinically. X-rays of the wrist,  CT head, facial performed.  3:34 PM Patient awake and alert, wounds have been dressed, patient is speaking clearly, hemodynamically unremarkable. We discussed findings of fracture of the wrist, and nose at length. Patient will have splint applied by orthopedic technician, will be discharged to follow-up with both ENT and orthopedics for his respective fractures per Absent other complaints, with no neurovascular compromise, no distress, the patient is appropriate for discharge with close outpatient follow-up.  Final Clinical Impressions(s) / ED Diagnoses   Final diagnoses:  Fall, initial encounter  Closed fracture of nasal bone, initial encounter  Closed fracture of left wrist, initial encounter     Carmin Muskrat, MD 07/14/18 1537

## 2018-07-14 NOTE — ED Triage Notes (Addendum)
Pt was removing boards off deck, lost balance skinned face, knees and has swelling to nose. Tenderness in left wrist. No Loc or neck tenderness.

## 2018-07-14 NOTE — ED Notes (Signed)
I have cleansed all of his abrasions and have applied Bacitracin dressings to same. As I write this, our ortho. Tech. Is applying his left wrist splint.

## 2018-07-14 NOTE — Discharge Instructions (Signed)
As discussed, with fractures of both your nose and your wrist that is importantly follow-up with our respective specialists in 1 week. Please keep all of your wounds clean, dry, and coated with a light amount of topical antibiotic.  Return here for concerning changes in your condition.

## 2018-07-23 NOTE — Progress Notes (Signed)
Spoke to [place] at Dr. Noreene Filbert office regarding COVID-19 testing in patient with nasal fracture. Per Leafy Ro per Dr. Erik Obey patient does not have a contraindication to getting tested for COVID-19.

## 2018-07-23 NOTE — H&P (Signed)
Otolaryngology Clinic Note  HPI:    Thomas Mcclain is a 73 y.o. male who presents as a new patient with a chief complaint of nasal injury.  Five days ago, he was taking planks off of his deck and fell off of the deck and struck his face on pavement.  He did not lose consciousness.  He went to the ER where he was found to have a broken left wrist and a broken nose.  His face swelled but has improved.  He has not noticed deformity of the nose.  Nasal breathing seems pretty good.  PMH/Meds/All/SocHx/FamHx/ROS:   Past Medical History      Past Medical History:  Diagnosis Date  . Anemia due to vitamin B12 deficiency 08/19/2015  . Colon polyp   . Elevated PSA   . Erectile dysfunction   . Essential hypertension 08/19/2015  . Hypertension   . Prostate cancer    s/p prostatectomy  . Reflux esophagitis       Past Surgical History       Past Surgical History:  Procedure Laterality Date  . FOOT SURGERY    . HERNIA REPAIR    . LAPAROSCOPIC NISSEN FUNDOPLICATION N/A [id number]/7096   Procedure: NISSEN FUNDOPLICATION LAPAROSCOPIC;  Surgeon: Nena Polio, MD;  Location: [facility] MAIN OR;  Service: General;  Laterality: N/A;  . PARAESOPHAGEAL HERNIA REPAIR N/A 08/26/2015   Procedure: HIATAL HERNIA REPAIR LAPAROSCOPIC. EGD;  Surgeon: Nena Polio, MD;  Location: [facility] MAIN OR;  Service: General;  Laterality: N/A;  . PROSTATE SURGERY        No family history of bleeding disorders, wound healing problems or difficulty with anesthesia.   Social History  Social History        Socioeconomic History  . Marital status: Married    Spouse name: Not on file  . Number of children: Not on file  . Years of education: Not on file  . Highest education level: Not on file  Occupational History  . Not on file  Social Needs  . Financial resource strain: Not on file  . Food insecurity    Worry: Not on file    Inability: Not on file  . Transportation needs     Medical: Not on file    Non-medical: Not on file  Tobacco Use  . Smoking status: Former Smoker    Packs/day: 2.00    Years: 15.00    Pack years: 30.00    Types: Cigarettes    Last attempt to quit: 1979    Years since quitting: 41.5  . Smokeless tobacco: Never Used  . Tobacco comment: Quit [id number]  Substance and Sexual Activity  . Alcohol use: No  . Drug use: No  . Sexual activity: Not on file  Lifestyle  . Physical activity    Days per week: Not on file    Minutes per session: Not on file  . Stress: Not on file  Relationships  . Social Herbalist on phone: Not on file    Gets together: Not on file    Attends religious service: Not on file    Active member of club or organization: Not on file    Attends meetings of clubs or organizations: Not on file    Relationship status: Not on file  Other Topics Concern  . Not on file  Social History Narrative  . Not on file       Current Outpatient Medications:  .  aspirin-acetaminophen-caffeine ([place]) [phone]  mg per tablet, Take 1 tablet by mouth., Disp: , Rfl:  .  cyanocobalamin (VITAMIN B12) 1000 MCG tablet, Take by mouth., Disp: , Rfl:  .  diphenhydrAMINE (BENADRYL) 25 mg tablet, Take 25 mg by mouth., Disp: , Rfl:  .  diphenoxylate-atropine (LOMOTIL) 2.5-0.025 mg per tablet, Take 1 tablet by mouth 4 (four) times daily as needed for Diarrhea., Disp: 20 tablet, Rfl: 0 .  ferrous sulfate (FERATAB) 325 (65 FE) MG tablet, Take by mouth., Disp: , Rfl:  .  loperamide (IMODIUM) 2 mg capsule, Take 2 mg by mouth 4 (four) times daily as needed for Diarrhea., Disp: , Rfl:  .  loratadine-pseudoephedrine (CLARITIN-D 12-HOUR) 5-120 mg per tablet, Take 1 tablet by mouth., Disp: , Rfl:  .  multivitamin (MULTIVITAMIN) per tablet, Take 1 tablet by mouth daily., Disp: , Rfl:  .  omega-3 fatty acids-fish oil (FISH OIL) 360-1,200 mg Cap, Take by mouth., Disp: , Rfl:  .  oxyCODONE (ROXICODONE)  5 MG immediate release tablet, TAKE 1 TABLET BY MOUTH EVERY 6 HOURS AS NEEDED FOR UP TO 12 DOSES FOR SEVERE PAIN., Disp: , Rfl:  .  budesonide (ENTOCORT EC) 3 mg 24 hr capsule, Take 9mg  daily x 6 weeks then 6mg  daily x 2 weeks then 3mg  daily x 2 weeks, Disp: 168 capsule, Rfl: 0 .  docosahexaenoic acid-epa 120-180 mg Cap, Take 1 capsule by mouth., Disp: , Rfl:  .  lisinopril-hydrochlorothiazide (PRINZIDE,ZESTORETIC) 20-25 mg per tablet, Take 1 tablet by mouth daily., Disp: , Rfl:  .  multivitamin (THERA) tablet, Take by mouth., Disp: , Rfl:   A complete ROS was performed with pertinent positives/negatives noted in the HPI. The remainder of the ROS are negative.    Physical Exam:    There were no vitals taken for this visit.  Appearance: alert, NAD, pleasant and cooperative Ability to communicate: normal voice Left ear: external ear normal, external auditory canal without substantial cerumen, tympanic membrane intact, middle ear aerated Right ear: external ear normal, external auditory canal without substantial cerumen, tympanic membrane intact, middle ear aerated Hearing: understands normal conversational speech Nose: external nose with rightward curvature, septum relatively midline, mild inferior turbinate hypertrophy; no lesions, masses, polyps or exudates  Lips, teeth, gums: no lesions, good dentition, healthy gums Oropharynx: mucosa without ulcers, masses or leukoplakia, normal tongue, tonsils 1+ Neck: no mass or tenderness, normal neck landmarks Thyroid: no thyromegaly or mass Lymphatics: no cervical adenopathy Salivary glands: soft, symmetric, no masses Facial strength: normal and symmetric CN II-XII intact  Independent Review of Additional Tests or Records:  None  Procedures:  None   Impression & Plans:  Thomas Mcclain is a 73 y.o. male with nasal fracture.  - His nose is fractured and displaced to the right.  We discussed options and agreed to proceed with closed  nasal reduction.  Risks, benefits, and alternatives were discussed and he expressed understanding and agreement.  Dr. Erik Obey will perform the surgery next week.  Melida Quitter, MD Otolaryngology    Electronically signed by: Hessie Knows, MD 07/20/18 [phone]

## 2018-07-24 ENCOUNTER — Other Ambulatory Visit (HOSPITAL_COMMUNITY)
Admission: RE | Admit: 2018-07-24 | Discharge: 2018-07-24 | Disposition: A | Discharge status: Home / Self Care | Payer: Medicare Other | Source: Ambulatory Visit | Attending: Otolaryngology | Admitting: Otolaryngology

## 2018-07-24 ENCOUNTER — Encounter (HOSPITAL_COMMUNITY): Payer: Self-pay | Admitting: *Deleted

## 2018-07-24 ENCOUNTER — Other Ambulatory Visit: Payer: Self-pay

## 2018-07-24 DIAGNOSIS — Z1159 Encounter for screening for other viral diseases: Secondary | ICD-10-CM | POA: Insufficient documentation

## 2018-07-24 LAB — SARS CORONAVIRUS 2 (TAT 6-24 HRS): SARS Coronavirus 2: NEGATIVE

## 2018-07-24 NOTE — Anesthesia Preprocedure Evaluation (Addendum)
Anesthesia Evaluation  Patient identified by MRN, date of birth, ID band Patient awake    Reviewed: Allergy & Precautions, NPO status , Patient's Chart, lab work & pertinent test results  Airway Mallampati: II  TM Distance: >3 FB Neck ROM: Full    Dental no notable dental hx.    Pulmonary neg pulmonary ROS, former smoker,    Pulmonary exam normal breath sounds clear to auscultation       Cardiovascular hypertension, Pt. on medications negative cardio ROS Normal cardiovascular exam Rhythm:Regular Rate:Normal  Echo- 11/20/16 Left ventricle: The cavity size was normal. There was mild focal basal hypertrophy of the septum. Systolic function was normal. The estimated ejection fraction was in the range of 55% to 60%.Wall motion was normal; there were no regional wall motion abnormalities. There was fusion of early and atrial contributions to ventricular filling. The study is not technically sufficient to allow evaluation of LV diastolic function. - Aortic valve: There was mild regurgitation. - Left atrium: The atrium was mildly dilated   Neuro/Psych negative neurological ROS  negative psych ROS   GI/Hepatic negative GI ROS, Neg liver ROS, hiatal hernia,   Endo/Other  negative endocrine ROS  Renal/GU Renal InsufficiencyRenal diseasenegative Renal ROS   BPH Frequency Nocturia negative genitourinary   Musculoskeletal negative musculoskeletal ROS (+) Arthritis , Osteoarthritis,    Abdominal   Peds negative pediatric ROS (+)  Hematology negative hematology ROS (+)   Anesthesia Other Findings   Reproductive/Obstetrics negative OB ROS                            Anesthesia Physical Anesthesia Plan  ASA: II  Anesthesia Plan: General   Post-op Pain Management:    Induction: Intravenous  PONV Risk Score and Plan: 2 and Ondansetron, Treatment may vary due to age or medical condition and  Midazolam  Airway Management Planned: LMA  Additional Equipment:   Intra-op Plan:   Post-operative Plan: Extubation in OR  Informed Consent: I have reviewed the patients History and Physical, chart, labs and discussed the procedure including the risks, benefits and alternatives for the proposed anesthesia with the patient or authorized representative who has indicated his/her understanding and acceptance.     Dental advisory given  Plan Discussed with: CRNA  Anesthesia Plan Comments:        Anesthesia Quick Evaluation

## 2018-07-24 NOTE — Progress Notes (Signed)
Spoke with pt for pre-op call. Pt denies cardiac history or Diabetes. Pt has hx of HTN but has not been on meds for over a year. Pt's PCP is Dr. Doreene Burke.  I instructed pt that he needed to go and be tested for Covid 19 this afternoon (Dr. Erik Obey, per R. Sherren Mocha, RN said it was ok for pt have nasal swab done - see note dated 07/23/18). Gave him directions to [facility], he states he is going there as soon as we finished the call. Pt also given quarantine instructions and he voiced understanding.   Coronavirus Screening  Have you experienced the following symptoms:  Cough NO Fever (>100.13F)  NO Runny nose NO Sore throat NO Difficulty breathing/shortness of breath  NO  Have you or a family member traveled in the last 14 days and where? NO   Patient reminded that hospital visitation restrictions are in effect and the importance of the restrictions. Patient informed that his wife may sit in the waiting room while he is in pre-op, surgery and PACU. He voiced understanding.

## 2018-07-25 ENCOUNTER — Ambulatory Visit (HOSPITAL_COMMUNITY): Payer: Medicare Other | Admitting: Registered Nurse

## 2018-07-25 ENCOUNTER — Encounter (HOSPITAL_COMMUNITY)
Admission: RE | Disposition: A | Discharge status: Home / Self Care | Payer: Self-pay | Source: Home / Self Care | Attending: Otolaryngology

## 2018-07-25 ENCOUNTER — Encounter (HOSPITAL_COMMUNITY): Payer: Self-pay

## 2018-07-25 ENCOUNTER — Ambulatory Visit (HOSPITAL_COMMUNITY)
Admission: RE | Admit: 2018-07-25 | Discharge: 2018-07-25 | Disposition: A | Discharge status: Home / Self Care | Payer: Medicare Other | Attending: Otolaryngology | Admitting: Otolaryngology

## 2018-07-25 DIAGNOSIS — S022XXA Fracture of nasal bones, initial encounter for closed fracture: Secondary | ICD-10-CM | POA: Diagnosis present

## 2018-07-25 DIAGNOSIS — Z7982 Long term (current) use of aspirin: Secondary | ICD-10-CM | POA: Insufficient documentation

## 2018-07-25 DIAGNOSIS — Y92008 Other place in unspecified non-institutional (private) residence as the place of occurrence of the external cause: Secondary | ICD-10-CM | POA: Insufficient documentation

## 2018-07-25 DIAGNOSIS — M1990 Unspecified osteoarthritis, unspecified site: Secondary | ICD-10-CM | POA: Diagnosis not present

## 2018-07-25 DIAGNOSIS — Z8546 Personal history of malignant neoplasm of prostate: Secondary | ICD-10-CM | POA: Diagnosis not present

## 2018-07-25 DIAGNOSIS — I10 Essential (primary) hypertension: Secondary | ICD-10-CM | POA: Insufficient documentation

## 2018-07-25 DIAGNOSIS — Z79899 Other long term (current) drug therapy: Secondary | ICD-10-CM | POA: Diagnosis not present

## 2018-07-25 DIAGNOSIS — W1789XA Other fall from one level to another, initial encounter: Secondary | ICD-10-CM | POA: Insufficient documentation

## 2018-07-25 DIAGNOSIS — D519 Vitamin B12 deficiency anemia, unspecified: Secondary | ICD-10-CM | POA: Insufficient documentation

## 2018-07-25 DIAGNOSIS — Z87891 Personal history of nicotine dependence: Secondary | ICD-10-CM | POA: Insufficient documentation

## 2018-07-25 DIAGNOSIS — S022XXD Fracture of nasal bones, subsequent encounter for fracture with routine healing: Secondary | ICD-10-CM

## 2018-07-25 HISTORY — DX: Personal history of other diseases of the digestive system: Z87.19

## 2018-07-25 HISTORY — PX: CLOSED REDUCTION NASAL FRACTURE: SHX5365

## 2018-07-25 HISTORY — DX: Unspecified osteoarthritis, unspecified site: M19.90

## 2018-07-25 LAB — COMPREHENSIVE METABOLIC PANEL
ALT: 15 U/L (ref 0–44)
AST: 20 U/L (ref 15–41)
Albumin: 3.6 g/dL (ref 3.5–5.0)
Alkaline Phosphatase: 89 U/L (ref 38–126)
Anion gap: 9 (ref 5–15)
BUN: 11 mg/dL (ref 8–23)
CO2: 21 mmol/L — ABNORMAL LOW (ref 22–32)
Calcium: 8.8 mg/dL — ABNORMAL LOW (ref 8.9–10.3)
Chloride: 107 mmol/L (ref 98–111)
Creatinine, Ser: 0.99 mg/dL (ref 0.61–1.24)
GFR calc Af Amer: >60 mL/min (ref >60)
GFR calc non Af Amer: >60 mL/min (ref >60)
Glucose, Bld: 101 mg/dL — ABNORMAL HIGH (ref 70–99)
Potassium: 3.8 mmol/L (ref 3.5–5.1)
Sodium: 137 mmol/L (ref 135–145)
Total Bilirubin: 0.7 mg/dL (ref 0.3–1.2)
Total Protein: 6.6 g/dL (ref 6.5–8.1)

## 2018-07-25 LAB — CBC
HCT: 45 % (ref 39.0–52.0)
Hemoglobin: 15.4 g/dL (ref 13.0–17.0)
MCH: 29.8 pg (ref 26.0–34.0)
MCHC: 34.2 g/dL (ref 30.0–36.0)
MCV: 87.2 fL (ref 80.0–100.0)
Platelets: 213 10*3/uL (ref 150–400)
RBC: 5.16 MIL/uL (ref 4.22–5.81)
RDW: 14.2 % (ref 11.5–15.5)
WBC: 6.1 10*3/uL (ref 4.0–10.5)
nRBC: 0 % (ref 0.0–0.2)

## 2018-07-25 SURGERY — CLOSED REDUCTION, FRACTURE, NASAL BONE
Anesthesia: General | Site: Nose

## 2018-07-25 MED ORDER — MIDAZOLAM HCL 2 MG/2ML IJ SOLN
INTRAMUSCULAR | Status: AC
  Filled 2018-07-25: qty 2

## 2018-07-25 MED ORDER — LIDOCAINE 2% (20 MG/ML) 5 ML SYRINGE
INTRAMUSCULAR | Status: AC
  Filled 2018-07-25: qty 5

## 2018-07-25 MED ORDER — ONDANSETRON HCL 4 MG/2ML IJ SOLN
INTRAMUSCULAR | Status: AC
  Filled 2018-07-25: qty 2

## 2018-07-25 MED ORDER — LIDOCAINE-EPINEPHRINE 1 %-1:100000 IJ SOLN
INTRAMUSCULAR | Status: AC
  Filled 2018-07-25: qty 1

## 2018-07-25 MED ORDER — LIDOCAINE-EPINEPHRINE 1 %-1:100000 IJ SOLN: INTRAMUSCULAR | Status: DC | PRN

## 2018-07-25 MED ORDER — LACTATED RINGERS IV SOLN
INTRAVENOUS | Status: DC | PRN
  Administered 2018-07-25: 07:00:00 via INTRAVENOUS

## 2018-07-25 MED ORDER — PROPOFOL 10 MG/ML IV BOLUS
INTRAVENOUS | Status: AC
  Filled 2018-07-25: qty 40

## 2018-07-25 MED ORDER — PROPOFOL 10 MG/ML IV BOLUS
INTRAVENOUS | Status: DC | PRN
  Administered 2018-07-25: 50 mg via INTRAVENOUS
  Administered 2018-07-25: 120 mg via INTRAVENOUS

## 2018-07-25 MED ORDER — FENTANYL CITRATE (PF) 250 MCG/5ML IJ SOLN
INTRAMUSCULAR | Status: AC
  Filled 2018-07-25: qty 5

## 2018-07-25 MED ORDER — OXYMETAZOLINE HCL 0.05 % NA SOLN
2.0000 | NASAL | Status: DC | PRN
  Administered 2018-07-25: 2 via NASAL
  Filled 2018-07-25: qty 30

## 2018-07-25 MED ORDER — LIDOCAINE 2% (20 MG/ML) 5 ML SYRINGE
INTRAMUSCULAR | Status: DC | PRN
  Administered 2018-07-25 (×2): 50 mg via INTRAVENOUS

## 2018-07-25 MED ORDER — FENTANYL CITRATE (PF) 100 MCG/2ML IJ SOLN
INTRAMUSCULAR | Status: DC | PRN
  Administered 2018-07-25 (×2): 25 ug via INTRAVENOUS

## 2018-07-25 MED ORDER — OXYMETAZOLINE HCL 0.05 % NA SOLN
NASAL | Status: AC
  Filled 2018-07-25: qty 30

## 2018-07-25 MED ORDER — ONDANSETRON HCL 4 MG/2ML IJ SOLN
INTRAMUSCULAR | Status: DC | PRN
  Administered 2018-07-25: 4 mg via INTRAVENOUS

## 2018-07-25 MED ORDER — 0.9 % SODIUM CHLORIDE (POUR BTL) OPTIME
TOPICAL | Status: DC | PRN
  Administered 2018-07-25: 1000 mL

## 2018-07-25 SURGICAL SUPPLY — 32 items
BLADE SURG 15 STRL LF DISP TIS (BLADE) IMPLANT
BLADE SURG 15 STRL SS (BLADE)
CANISTER SUCT 3000ML PPV (MISCELLANEOUS) ×2 IMPLANT
COVER BACK TABLE 60X90IN (DRAPES) ×2 IMPLANT
COVER MAYO STAND STRL (DRAPES) ×2 IMPLANT
COVER WAND RF STERILE (DRAPES) ×2 IMPLANT
DRAPE HALF SHEET 40X57 (DRAPES) IMPLANT
DRESSING NASAL KENNEDY 3.5X.9 (MISCELLANEOUS) ×2 IMPLANT
DRESSING NASAL POPE 10X1.5X2.5 (GAUZE/BANDAGES/DRESSINGS) ×2 IMPLANT
DRSG NASAL KENNEDY 3.5X.9 (MISCELLANEOUS)
DRSG NASAL POPE 10X1.5X2.5 (GAUZE/BANDAGES/DRESSINGS) ×4
GAUZE 4X4 16PLY RFD (DISPOSABLE) ×2 IMPLANT
GAUZE SPONGE 2X2 8PLY STRL LF (GAUZE/BANDAGES/DRESSINGS) ×1 IMPLANT
GLOVE BIO SURGEON STRL SZ 6.5 (GLOVE) ×1 IMPLANT
GLOVE BIO SURGEON STRL SZ8 (GLOVE) ×1 IMPLANT
GLOVE ECLIPSE 8.0 STRL XLNG CF (GLOVE) IMPLANT
GOWN STRL REUS W/ TWL LRG LVL3 (GOWN DISPOSABLE) ×1 IMPLANT
GOWN STRL REUS W/TWL LRG LVL3 (GOWN DISPOSABLE) ×1
KIT BASIN OR (CUSTOM PROCEDURE TRAY) ×2 IMPLANT
KIT SPLINT NASAL DENVER SM BEI (GAUZE/BANDAGES/DRESSINGS) ×2 IMPLANT
KIT TURNOVER KIT B (KITS) ×2 IMPLANT
NDL HYPO 25GX1X1/2 BEV (NEEDLE) IMPLANT
NEEDLE HYPO 25GX1X1/2 BEV (NEEDLE) IMPLANT
NS IRRIG 1000ML POUR BTL (IV SOLUTION) ×2 IMPLANT
PAD ARMBOARD 7.5X6 YLW CONV (MISCELLANEOUS) ×4 IMPLANT
PATTIES SURGICAL .5 X3 (DISPOSABLE) IMPLANT
POSITIONER HEAD DONUT 9IN (MISCELLANEOUS) IMPLANT
SPONGE GAUZE 2X2 STER 10/PKG (GAUZE/BANDAGES/DRESSINGS)
SYR CONTROL 10ML LL (SYRINGE) IMPLANT
TOWEL GREEN STERILE FF (TOWEL DISPOSABLE) ×4 IMPLANT
TUBE CONNECTING 12X1/4 (SUCTIONS) ×2 IMPLANT
WATER STERILE IRR 1000ML POUR (IV SOLUTION) ×2 IMPLANT

## 2018-07-25 NOTE — Transfer of Care (Signed)
Immediate Anesthesia Transfer of Care Note  Patient: Thomas Mcclain  Procedure(s) Performed: CLOSED REDUCTION NASAL FRACTURE (N/A Nose)  Patient Location: PACU  Anesthesia Type:General  Level of Consciousness: awake, patient cooperative and responds to stimulation  Airway & Oxygen Therapy: Patient Spontanous Breathing  Post-op Assessment: Report given to RN and Post -op Vital signs reviewed and stable  Post vital signs: Reviewed and stable  Last Vitals:  Vitals Value Taken Time  BP 123/86 07/25/18 0808  Temp    Pulse 72 07/25/18 0811  Resp 17 07/25/18 0811  SpO2 94 % 07/25/18 0811  Vitals shown include unvalidated device data.  Last Pain:  Vitals:   07/25/18 0809  PainSc: (P) 0-No pain      Patients Stated Pain Goal: 2 (05/27/89 0289)  Complications: No apparent anesthesia complications

## 2018-07-25 NOTE — Op Note (Signed)
07/25/2018  8:14 AM    Thomas Mcclain  [id number]   Pre-Op Dx: Closed displaced nasal fracture  Post-op Dx: Same  Proc: Closed reduction nasal fracture with stabilization  Surg:  Tyson Alias MD  Anes:  GLMA  EBL: Minimal  Comp: None  Findings: Rightward displaced bony dorsum and a right septal displacement as well.    Procedure: The patient received preoperative Afrin spray.  In a comfortable supine position on the gurney, general LMA anesthesia was administered in the standard fashion.  A routine surgical timeout was obtained.  The nose was examined internally and externally with the findings as described above.  The blunt fracture elevator was measured to the level of the medial canthus to avoid trauma to the cribriform plate region.  It was placed under the dorsum of the nose and with anterior and left lateral pressure, the bony dorsum was mobilized and replaced in a midline position.  It was felt to be stable.  The external nose was cleaned with alcohol and painted with skin prep.  Steri-Strips were applied in the standard fashion.  A small-medium [place] splint was molded and then applied to the nose and compressed slightly for support.  A small amount of blood was suctioned from the pharynx using a Yankauer suction.  At this point the procedure was completed.  The patient was returned to anesthesia, awakened, extubated, and transferred to recovery in stable condition.  Dispo:   PACU to home  Plan: Ice, elevation.  We will remove the external splint in 10 days.  Tyson Alias MD

## 2018-07-25 NOTE — Discharge Instructions (Signed)
Ice pack x 24 hrs Keep head elevated 2-3 nights Keep splint dry Recheck my office 10 days, [phone] for an appointment If splint falls off before 7 days, tape it back on day and night.  After 7 days at night only. After 10 days, OK to leave it off.

## 2018-07-25 NOTE — Interval H&P Note (Signed)
History and Physical Interval Note:  07/25/2018 7:37 AM  Thomas Mcclain  has presented today for surgery, with the diagnosis of nasal fracture.  The various methods of treatment have been discussed with the patient and family. After consideration of risks, benefits and other options for treatment, the patient has consented to  Procedure(s): CLOSED REDUCTION NASAL FRACTURE (N/A) as a surgical intervention.  The patient's history has been re-reviewed, patient re-examined, no change in status, stable for surgery.  I have re-reviewed the patient's chart and labs.  Questions were answered to the patient's satisfaction.     Ileene Hutchinson [facility]

## 2018-07-25 NOTE — Anesthesia Postprocedure Evaluation (Signed)
Anesthesia Post Note  Patient: Thomas Mcclain  Procedure(s) Performed: CLOSED REDUCTION NASAL FRACTURE (N/A Nose)     Patient location during evaluation: PACU Anesthesia Type: General Level of consciousness: awake and alert Pain management: pain level controlled Vital Signs Assessment: post-procedure vital signs reviewed and stable Respiratory status: spontaneous breathing, nonlabored ventilation and respiratory function stable Cardiovascular status: blood pressure returned to baseline and stable Postop Assessment: no apparent nausea or vomiting Anesthetic complications: no    Last Vitals:  Vitals:   07/25/18 0820 07/25/18 0825  BP:  (!) 126/91  Pulse: 70 66  Resp: 15 16  Temp:  36.7 C  SpO2: 94% 93%    Last Pain:  Vitals:   07/25/18 0825  PainSc: 0-No pain                 Lynda Rainwater

## 2018-07-26 ENCOUNTER — Encounter (HOSPITAL_COMMUNITY): Payer: Self-pay | Admitting: Otolaryngology

## 2018-10-06 ENCOUNTER — Ambulatory Visit (HOSPITAL_COMMUNITY)
Admission: EM | Admit: 2018-10-06 | Discharge: 2018-10-06 | Disposition: A | Discharge status: Home / Self Care | Payer: Medicare Other | Attending: Emergency Medicine | Admitting: Emergency Medicine

## 2018-10-06 ENCOUNTER — Telehealth (HOSPITAL_COMMUNITY): Payer: Self-pay | Admitting: Emergency Medicine

## 2018-10-06 ENCOUNTER — Other Ambulatory Visit: Payer: Self-pay

## 2018-10-06 ENCOUNTER — Encounter (HOSPITAL_COMMUNITY): Payer: Self-pay

## 2018-10-06 DIAGNOSIS — B029 Zoster without complications: Secondary | ICD-10-CM

## 2018-10-06 MED ORDER — VALACYCLOVIR HCL 1 G PO TABS: 1000.0000 mg | ORAL_TABLET | Freq: Three times a day (TID) | ORAL | 0 refills | Status: AC

## 2018-10-06 MED ORDER — MUPIROCIN 2 % EX OINT: 1.0000 "application " | TOPICAL_OINTMENT | Freq: Three times a day (TID) | CUTANEOUS | 0 refills | Status: DC

## 2018-10-06 MED ORDER — VALACYCLOVIR HCL 1 G PO TABS: 1000.0000 mg | ORAL_TABLET | Freq: Three times a day (TID) | ORAL | 0 refills | Status: DC

## 2018-10-06 MED FILL — MUPIROCIN 2% OINTMENT: 2 | 7 days supply | Qty: 22 | Fill #0

## 2018-10-06 MED FILL — valACYclovir HCL 1 GM TABS: 1 | 7 days supply | Qty: 21 | Fill #0

## 2018-10-06 NOTE — ED Triage Notes (Signed)
Pt states he has left ear discomfort.

## 2018-10-06 NOTE — Discharge Instructions (Addendum)
Finish the Valtrex, even if you feel better.  The Bactroban is an ointment that will help prevent infection.  You may take 1 g of Tylenol 3-4 times a day as needed for pain.  Go immediately to the ER if this rash spreads all over your body, eye pain, visual changes, or other concerns.

## 2018-10-06 NOTE — ED Provider Notes (Signed)
HPI  SUBJECTIVE:  Thomas Mcclain is a 73 y.o. male who presents with 4 to 5 days of intermittent sharp left ear pain that lasts seconds and then resolves.  It radiates down his neck.  He also reports constant burning posterior left neck pain.  He reports rhinorrhea, postnasal drip, left-sided sore throat.  No fevers, otorrhea, change in his hearing, nasal congestion, recent swimming.  He uses Q-tips.  No facial pain, facial rash.  No loss of sense of smell or taste, cough, shortness of breath, nausea, vomiting, abdominal pain.  He notes some loose stools starting today.  No exposure to COVID, but he drives a school bus.  He does not remember what he has tried for symptoms but there are no aggravating or alleviating factors.  His ear pain is not associated chewing, yawning.  He denies ear popping.  He has never had symptoms like this before.  He has a past medical history of hypertension, esophagitis, GI bleed, renal insufficiency, prostate cancer.  No history of shingles, TMJ disorder, trigeminal neuralgia.  PMD: Cannot remember name.   Past Medical History:  Diagnosis Date  . Allergic rhinitis   . Arthritis   . BPH (benign prostatic hyperplasia)   . Cancer [facility])    pt denies  . Chronic diarrhea   . Frequency of urination   . History of hiatal hernia   . Hypertension   . Iron deficiency   . Nocturia   . Renal insufficiency 11/18/2016    Past Surgical History:  Procedure Laterality Date  . ABDOMINAL SURGERY    . CLOSED REDUCTION NASAL FRACTURE N/A 07/25/2018   Procedure: CLOSED REDUCTION NASAL FRACTURE;  Surgeon: Jodi Marble, MD;  Location: [facility] OR;  Service: ENT;  Laterality: N/A;  . ESOPHAGOGASTRODUODENOSCOPY N/A 05/23/2013   Procedure: ESOPHAGOGASTRODUODENOSCOPY (EGD);  Surgeon: Ladene Artist, MD;  Location: [facility] ENDOSCOPY;  Service: Endoscopy;  Laterality: N/A;  . LEFT ANKLE DEBRIDEMENT AND FUSION  2010  . TRANSURETHRAL RESECTION OF PROSTATE  01/31/2011   Procedure: TRANSURETHRAL  RESECTION OF THE PROSTATE WITH GYRUS INSTRUMENTS;  Surgeon: Bernestine Amass, MD;  Location: [facility];  Service: Urology;  Laterality: N/A;  GYRUS OWER    Family History  Problem Relation Age of Onset  . Diabetes Mother   . Kidney Stones Sister   . Memory loss Brother     Social History   Tobacco Use  . Smoking status: Former Smoker    Types: Cigarettes    Quit date: 01/24/1977    Years since quitting: 41.7  . Smokeless tobacco: Never Used  Substance Use Topics  . Alcohol use: Yes    Alcohol/week: 14.0 standard drinks    Types: 14 Cans of beer per week    Comment: occasional  . Drug use: No    No current facility-administered medications for this encounter.   Current Outpatient Medications:  .  aspirin-acetaminophen-caffeine (EXCEDRIN MIGRAINE) 250-250-65 MG tablet, Take 1 tablet every 6 (six) hours as needed by mouth for migraine., Disp: , Rfl:  .  diphenhydrAMINE (SOMINEX) 25 MG tablet, Take 25 mg by mouth at bedtime as needed for allergies or sleep., Disp: , Rfl:  .  ferrous sulfate 325 (65 FE) MG tablet, Take 325 mg daily by mouth., Disp: , Rfl:  .  loratadine-pseudoephedrine (CLARITIN-D 12-HOUR) 5-120 MG per tablet, Take 1 tablet by mouth as needed for allergies. , Disp: , Rfl:  .  Multiple Vitamin (MULTIVITAMIN WITH MINERALS) TABS tablet, Take 1 tablet by mouth  daily., Disp: , Rfl:  .  mupirocin ointment (BACTROBAN) 2 %, Apply 1 application topically 3 (three) times daily., Disp: 22 g, Rfl: 0 .  Omega-3 1000 MG CAPS, Take 1 capsule by mouth daily., Disp: , Rfl:  .  valACYclovir (VALTREX) 1000 MG tablet, Take 1 tablet (1,000 mg total) by mouth 3 (three) times daily for 7 days., Disp: 21 tablet, Rfl: 0 .  vitamin B-12 (CYANOCOBALAMIN) 1000 MCG tablet, Take 1,000 mcg daily by mouth., Disp: , Rfl:   No Known Allergies   ROS  As noted in HPI.   Physical Exam  BP (!) 140/92 (BP Location: Right Arm)   Temp 97.9 F (36.6 C) (Oral)   Resp 18   Wt 74.8  kg   SpO2 99%   BMI 21.77 kg/m   Constitutional: Well developed, well nourished, no acute distress Eyes:  EOMI, conjunctiva normal bilaterally HENT: Normocephalic, atraumatic,mucus membranes moist positive tenderness left face.  No facial rash.  Left external ear normal.  No pain with traction on pinna, palpation of tragus.  Positive tender vesicular rash along the mastoid.  No swelling behind the ear. Left EAC, TM normal.  Right ear, EAC, TM normal. Respiratory: Normal inspiratory effort Cardiovascular: Normal rate GI: nondistended Skin: Positive tender vesicular rash in the left V3 distribution, over the neck, behind the ear, and the scalp.       Musculoskeletal: no deformities Neurologic: Alert & oriented x 3, no focal neuro deficits Psychiatric: Speech and behavior appropriate   ED Course   Medications - No data to display  No orders of the defined types were placed in this encounter.   No results found for this or any previous visit (from the past 24 hour(s)). No results found.  ED Clinical Impression  1. Herpes zoster without complication      ED Assessment/Plan  Patient with shingles. Doubt  COVID.  Sending home with Valtrex, Tylenol, no ibuprofen given history of renal insufficiency and GI bleed.  Normal BUN and creatinine on lab work done on 07/25/2018.  Discussed MDM, treatment plan, and plan for follow-up with patient. Discussed sn/sx that should prompt return to the ED. patient agrees with plan.   Meds ordered this encounter  Medications  . valACYclovir (VALTREX) 1000 MG tablet    Sig: Take 1 tablet (1,000 mg total) by mouth 3 (three) times daily for 7 days.    Dispense:  21 tablet    Refill:  0  . mupirocin ointment (BACTROBAN) 2 %    Sig: Apply 1 application topically 3 (three) times daily.    Dispense:  22 g    Refill:  0    *This clinic note was created using [employer]. Therefore, there may be occasional mistakes despite careful  proofreading.   ?    Melynda Ripple, MD 10/06/18 1425

## 2018-10-29 ENCOUNTER — Other Ambulatory Visit: Payer: Self-pay

## 2018-10-29 DIAGNOSIS — Z20822 Contact with and (suspected) exposure to covid-19: Secondary | ICD-10-CM

## 2018-10-30 LAB — NOVEL CORONAVIRUS, NAA: SARS-CoV-2, NAA: DETECTED — AB

## 2018-11-06 ENCOUNTER — Telehealth: Payer: Self-pay | Admitting: General Practice

## 2018-11-06 NOTE — Telephone Encounter (Signed)
Pt notified of positive COVID-19 test results. Pt verbalized understanding. Pt reports that he doesn't have any symptoms.Pt advised to remain in self quarantine until at least 10 days since symptom onset And 3 consecutive days fever free without antipyretics And improvement in respiratory symptoms. Patient advised to utilize over the counter medications to treat symptoms. Pt advised to seek treatment in the ED if respiratory issues/distress develops.Pt advised they should only leave home to seek and medical care and must wear a mask in public. Pt instructed to limit contact with family members or caregivers in the home. Pt advised to practice social distancing and to continue to use good preventative care measures such has frequent hand washing, staying out of crowds and cleaning hard surfaces frequently touched in the home.Pt informed that the health department will likely follow up and may have additional recommendations. [facility] Department has been notified

## 2018-11-06 NOTE — Telephone Encounter (Signed)
Pt returned call for Covid results. NT unavailable. Please return pt's call. [phone]

## 2019-09-23 MED FILL — DIPHENOXYLATE-ATROPINE 2.5-: 2.5-0.025 | 5 days supply | Qty: 20 | Fill #0

## 2020-02-19 ENCOUNTER — Other Ambulatory Visit: Payer: Self-pay

## 2020-02-19 ENCOUNTER — Ambulatory Visit (HOSPITAL_COMMUNITY)
Admission: EM | Admit: 2020-02-19 | Discharge: 2020-02-19 | Discharge status: Against Medical Advice | Payer: Medicare Other

## 2020-02-19 ENCOUNTER — Ambulatory Visit (HOSPITAL_COMMUNITY)
Admission: EM | Admit: 2020-02-19 | Discharge: 2020-02-19 | Disposition: A | Discharge status: Home / Self Care | Payer: Medicare Other | Attending: Family | Admitting: Family

## 2020-02-19 ENCOUNTER — Encounter (HOSPITAL_COMMUNITY): Payer: Self-pay | Admitting: Emergency Medicine

## 2020-02-19 DIAGNOSIS — S161XXA Strain of muscle, fascia and tendon at neck level, initial encounter: Secondary | ICD-10-CM | POA: Diagnosis not present

## 2020-02-19 DIAGNOSIS — Z8679 Personal history of other diseases of the circulatory system: Secondary | ICD-10-CM

## 2020-02-19 DIAGNOSIS — S39012A Strain of muscle, fascia and tendon of lower back, initial encounter: Secondary | ICD-10-CM | POA: Diagnosis not present

## 2020-02-19 MED ORDER — DICLOFENAC SODIUM 75 MG PO TBEC: 75.0000 mg | DELAYED_RELEASE_TABLET | Freq: Two times a day (BID) | ORAL | 0 refills | Status: DC | PRN

## 2020-02-19 NOTE — Discharge Instructions (Signed)
Recommend start Diclofenac 75mg  twice a day as needed for pain. Recommend apply warm compresses to back and neck for comfort. Follow-up here in 4 to 5 days if not improving or with a PCP if symptoms persist.
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
## 2020-02-20 ENCOUNTER — Emergency Department (HOSPITAL_COMMUNITY): Payer: BC Managed Care – PPO

## 2020-02-20 ENCOUNTER — Inpatient Hospital Stay (HOSPITAL_COMMUNITY): Payer: BC Managed Care – PPO

## 2020-02-20 ENCOUNTER — Inpatient Hospital Stay (HOSPITAL_COMMUNITY)
Admission: EM | Admit: 2020-02-20 | Discharge: 2020-02-22 | DRG: 811 | Disposition: A | Discharge status: Home / Self Care | Payer: BC Managed Care – PPO | Attending: Internal Medicine | Admitting: Internal Medicine

## 2020-02-20 ENCOUNTER — Other Ambulatory Visit: Payer: Self-pay

## 2020-02-20 ENCOUNTER — Encounter (HOSPITAL_COMMUNITY): Payer: Self-pay

## 2020-02-20 DIAGNOSIS — D62 Acute posthemorrhagic anemia: Secondary | ICD-10-CM | POA: Diagnosis present

## 2020-02-20 DIAGNOSIS — Z79899 Other long term (current) drug therapy: Secondary | ICD-10-CM | POA: Diagnosis not present

## 2020-02-20 DIAGNOSIS — K52839 Microscopic colitis, unspecified: Secondary | ICD-10-CM | POA: Diagnosis present

## 2020-02-20 DIAGNOSIS — H9202 Otalgia, left ear: Secondary | ICD-10-CM | POA: Diagnosis present

## 2020-02-20 DIAGNOSIS — Z833 Family history of diabetes mellitus: Secondary | ICD-10-CM | POA: Diagnosis not present

## 2020-02-20 DIAGNOSIS — Z87891 Personal history of nicotine dependence: Secondary | ICD-10-CM | POA: Diagnosis not present

## 2020-02-20 DIAGNOSIS — Z20822 Contact with and (suspected) exposure to covid-19: Secondary | ICD-10-CM | POA: Diagnosis present

## 2020-02-20 DIAGNOSIS — I10 Essential (primary) hypertension: Secondary | ICD-10-CM | POA: Diagnosis present

## 2020-02-20 DIAGNOSIS — K922 Gastrointestinal hemorrhage, unspecified: Secondary | ICD-10-CM

## 2020-02-20 DIAGNOSIS — N400 Enlarged prostate without lower urinary tract symptoms: Secondary | ICD-10-CM | POA: Diagnosis present

## 2020-02-20 DIAGNOSIS — G43909 Migraine, unspecified, not intractable, without status migrainosus: Secondary | ICD-10-CM | POA: Diagnosis present

## 2020-02-20 DIAGNOSIS — K2211 Ulcer of esophagus with bleeding: Secondary | ICD-10-CM | POA: Diagnosis present

## 2020-02-20 DIAGNOSIS — R55 Syncope and collapse: Secondary | ICD-10-CM | POA: Diagnosis present

## 2020-02-20 DIAGNOSIS — M549 Dorsalgia, unspecified: Secondary | ICD-10-CM

## 2020-02-20 LAB — I-STAT CHEM 8, ED
BUN: 41 mg/dL — ABNORMAL HIGH (ref 8–23)
Calcium, Ion: 1.19 mmol/L (ref 1.15–1.40)
Chloride: 105 mmol/L (ref 98–111)
Creatinine, Ser: 1 mg/dL (ref 0.61–1.24)
Glucose, Bld: 133 mg/dL — ABNORMAL HIGH (ref 70–99)
HCT: 19 % — ABNORMAL LOW (ref 39.0–52.0)
Hemoglobin: 6.5 g/dL — CL (ref 13.0–17.0)
Potassium: 3.9 mmol/L (ref 3.5–5.1)
Sodium: 140 mmol/L (ref 135–145)
TCO2: 25 mmol/L (ref 22–32)

## 2020-02-20 LAB — RESP PANEL BY RT-PCR (FLU A&B, COVID) ARPGX2
Influenza A by PCR: NEGATIVE
Influenza B by PCR: NEGATIVE
SARS Coronavirus 2 by RT PCR: NEGATIVE

## 2020-02-20 LAB — COMPREHENSIVE METABOLIC PANEL
ALT: 14 U/L (ref 0–44)
AST: 17 U/L (ref 15–41)
Albumin: 2.9 g/dL — ABNORMAL LOW (ref 3.5–5.0)
Alkaline Phosphatase: 48 U/L (ref 38–126)
Anion gap: 6 (ref 5–15)
BUN: 46 mg/dL — ABNORMAL HIGH (ref 8–23)
CO2: 25 mmol/L (ref 22–32)
Calcium: 7.8 mg/dL — ABNORMAL LOW (ref 8.9–10.3)
Chloride: 108 mmol/L (ref 98–111)
Creatinine, Ser: 1.05 mg/dL (ref 0.61–1.24)
GFR, Estimated: >60 mL/min (ref >60)
Glucose, Bld: 144 mg/dL — ABNORMAL HIGH (ref 70–99)
Potassium: 4 mmol/L (ref 3.5–5.1)
Sodium: 139 mmol/L (ref 135–145)
Total Bilirubin: 0.6 mg/dL (ref 0.3–1.2)
Total Protein: 4.9 g/dL — ABNORMAL LOW (ref 6.5–8.1)

## 2020-02-20 LAB — CBC
HCT: 21.7 % — ABNORMAL LOW (ref 39.0–52.0)
Hemoglobin: 7.4 g/dL — ABNORMAL LOW (ref 13.0–17.0)
MCH: 31.1 pg (ref 26.0–34.0)
MCHC: 34.1 g/dL (ref 30.0–36.0)
MCV: 91.2 fL (ref 80.0–100.0)
Platelets: 165 10*3/uL (ref 150–400)
RBC: 2.38 MIL/uL — ABNORMAL LOW (ref 4.22–5.81)
RDW: 14.3 % (ref 11.5–15.5)
WBC: 7.5 10*3/uL (ref 4.0–10.5)
nRBC: 0 % (ref 0.0–0.2)

## 2020-02-20 LAB — PROTIME-INR
INR: 1.2 (ref 0.8–1.2)
Prothrombin Time: 14.6 seconds (ref 11.4–15.2)

## 2020-02-20 LAB — PREPARE RBC (CROSSMATCH)

## 2020-02-20 MED ORDER — PANTOPRAZOLE SODIUM 40 MG IV SOLR
40.0000 mg | Freq: Once | INTRAVENOUS | Status: AC
  Administered 2020-02-20: 40 mg via INTRAVENOUS
  Filled 2020-02-20: qty 40

## 2020-02-20 MED ORDER — SODIUM CHLORIDE 0.9 % IV SOLN: INTRAVENOUS | Status: AC

## 2020-02-20 MED ORDER — SODIUM CHLORIDE 0.9 % IV SOLN
8.0000 mg/h | INTRAVENOUS | Status: DC
  Administered 2020-02-20: 8 mg/h via INTRAVENOUS
  Filled 2020-02-20 (×3): qty 80

## 2020-02-20 MED ORDER — SODIUM CHLORIDE 0.9 % IV BOLUS
1000.0000 mL | Freq: Once | INTRAVENOUS | Status: AC
  Administered 2020-02-20: 1000 mL via INTRAVENOUS

## 2020-02-20 MED ORDER — ACETAMINOPHEN 325 MG PO TABS
650.0000 mg | ORAL_TABLET | Freq: Four times a day (QID) | ORAL | Status: DC | PRN
  Administered 2020-02-21 – 2020-02-22 (×4): 650 mg via ORAL
  Filled 2020-02-20 (×4): qty 2

## 2020-02-20 MED ORDER — IOHEXOL 350 MG/ML SOLN
100.0000 mL | Freq: Once | INTRAVENOUS | Status: AC | PRN
  Administered 2020-02-20: 100 mL via INTRAVENOUS

## 2020-02-20 MED ORDER — SODIUM CHLORIDE 0.9% IV SOLUTION: Freq: Once | INTRAVENOUS | Status: DC

## 2020-02-20 MED ORDER — ACETAMINOPHEN 650 MG RE SUPP: 650.0000 mg | Freq: Four times a day (QID) | RECTAL | Status: DC | PRN

## 2020-02-20 MED ORDER — PANTOPRAZOLE SODIUM 40 MG IV SOLR: 40.0000 mg | Freq: Two times a day (BID) | INTRAVENOUS | Status: DC
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
## 2020-02-20 NOTE — ED Notes (Addendum)
Admitting MD at bedside.

## 2020-02-20 NOTE — ED Notes (Signed)
ED Provider at bedside. 
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
## 2020-02-21 ENCOUNTER — Inpatient Hospital Stay (HOSPITAL_COMMUNITY): Payer: BC Managed Care – PPO | Admitting: Certified Registered Nurse Anesthetist

## 2020-02-21 ENCOUNTER — Encounter (HOSPITAL_COMMUNITY): Payer: Self-pay | Admitting: Internal Medicine

## 2020-02-21 ENCOUNTER — Encounter (HOSPITAL_COMMUNITY)
Admission: EM | Disposition: A | Discharge status: Home / Self Care | Payer: Self-pay | Source: Home / Self Care | Attending: Internal Medicine

## 2020-02-21 DIAGNOSIS — K922 Gastrointestinal hemorrhage, unspecified: Secondary | ICD-10-CM | POA: Diagnosis not present

## 2020-02-21 HISTORY — PX: ESOPHAGOGASTRODUODENOSCOPY (EGD) WITH PROPOFOL: SHX5813

## 2020-02-21 LAB — URINALYSIS, ROUTINE W REFLEX MICROSCOPIC
Bilirubin Urine: NEGATIVE
Glucose, UA: NEGATIVE mg/dL
Hgb urine dipstick: NEGATIVE
Ketones, ur: NEGATIVE mg/dL
Leukocytes,Ua: NEGATIVE
Nitrite: NEGATIVE
Protein, ur: NEGATIVE mg/dL
Specific Gravity, Urine: 1.009 (ref 1.005–1.030)
pH: 6 (ref 5.0–8.0)

## 2020-02-21 LAB — HEMOGLOBIN AND HEMATOCRIT, BLOOD
HCT: 23.9 % — ABNORMAL LOW (ref 39.0–52.0)
HCT: 25.7 % — ABNORMAL LOW (ref 39.0–52.0)
HCT: 26 % — ABNORMAL LOW (ref 39.0–52.0)
Hemoglobin: 8.3 g/dL — ABNORMAL LOW (ref 13.0–17.0)
Hemoglobin: 9 g/dL — ABNORMAL LOW (ref 13.0–17.0)
Hemoglobin: 9 g/dL — ABNORMAL LOW (ref 13.0–17.0)

## 2020-02-21 LAB — HEMOGLOBIN A1C
Hgb A1c MFr Bld: 5.1 % (ref 4.8–5.6)
Mean Plasma Glucose: 99.67 mg/dL

## 2020-02-21 LAB — BASIC METABOLIC PANEL
Anion gap: 7 (ref 5–15)
BUN: 24 mg/dL — ABNORMAL HIGH (ref 8–23)
CO2: 23 mmol/L (ref 22–32)
Calcium: 7.9 mg/dL — ABNORMAL LOW (ref 8.9–10.3)
Chloride: 111 mmol/L (ref 98–111)
Creatinine, Ser: 0.78 mg/dL (ref 0.61–1.24)
GFR, Estimated: >60 mL/min (ref >60)
Glucose, Bld: 97 mg/dL (ref 70–99)
Potassium: 4 mmol/L (ref 3.5–5.1)
Sodium: 141 mmol/L (ref 135–145)

## 2020-02-21 LAB — PREPARE RBC (CROSSMATCH)

## 2020-02-21 SURGERY — ESOPHAGOGASTRODUODENOSCOPY (EGD) WITH PROPOFOL
Anesthesia: Monitor Anesthesia Care

## 2020-02-21 MED ORDER — PROPOFOL 500 MG/50ML IV EMUL
INTRAVENOUS | Status: DC | PRN
  Administered 2020-02-21: 100 ug/kg/min via INTRAVENOUS

## 2020-02-21 MED ORDER — LOPERAMIDE HCL 2 MG PO CAPS: 2.0000 mg | ORAL_CAPSULE | Freq: Four times a day (QID) | ORAL | Status: DC | PRN

## 2020-02-21 MED ORDER — VITAMIN B-12 1000 MCG PO TABS
1000.0000 ug | ORAL_TABLET | Freq: Every day | ORAL | Status: DC
  Administered 2020-02-22: 1000 ug via ORAL
  Filled 2020-02-21: qty 1

## 2020-02-21 MED ORDER — SODIUM CHLORIDE 0.9 % IV SOLN: INTRAVENOUS | Status: DC

## 2020-02-21 MED ORDER — PANTOPRAZOLE SODIUM 40 MG PO TBEC
40.0000 mg | DELAYED_RELEASE_TABLET | Freq: Two times a day (BID) | ORAL | Status: DC
  Administered 2020-02-21 – 2020-02-22 (×2): 40 mg via ORAL
  Filled 2020-02-21 (×2): qty 1

## 2020-02-21 MED ORDER — PROPOFOL 10 MG/ML IV BOLUS
INTRAVENOUS | Status: DC | PRN
  Administered 2020-02-21 (×2): 20 mg via INTRAVENOUS

## 2020-02-21 MED ORDER — OMEGA-3-ACID ETHYL ESTERS 1 G PO CAPS
1.0000 | ORAL_CAPSULE | Freq: Every day | ORAL | Status: DC
  Administered 2020-02-22: 1 g via ORAL
  Filled 2020-02-21: qty 1

## 2020-02-21 MED ORDER — FERROUS SULFATE 325 (65 FE) MG PO TABS
325.0000 mg | ORAL_TABLET | Freq: Every day | ORAL | Status: DC
  Administered 2020-02-22: 325 mg via ORAL
  Filled 2020-02-21: qty 1

## 2020-02-21 MED ORDER — POLYVINYL ALCOHOL 1.4 % OP SOLN
Freq: Every day | OPHTHALMIC | Status: DC | PRN
  Filled 2020-02-21 (×2): qty 15

## 2020-02-21 MED ORDER — ADULT MULTIVITAMIN W/MINERALS CH
1.0000 | ORAL_TABLET | Freq: Every day | ORAL | Status: DC
  Administered 2020-02-22: 1 via ORAL
  Filled 2020-02-21: qty 1

## 2020-02-21 MED ORDER — PHENYLEPHRINE HCL-NACL 10-0.9 MG/250ML-% IV SOLN
INTRAVENOUS | Status: AC
  Filled 2020-02-21: qty 500

## 2020-02-21 MED ORDER — LIDOCAINE 2% (20 MG/ML) 5 ML SYRINGE
INTRAMUSCULAR | Status: DC | PRN
  Administered 2020-02-21: 60 mg via INTRAVENOUS

## 2020-02-21 MED ORDER — PROPOFOL 10 MG/ML IV BOLUS
INTRAVENOUS | Status: AC
  Filled 2020-02-21: qty 40

## 2020-02-21 SURGICAL SUPPLY — 14 items
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
## 2020-02-21 NOTE — Transfer of Care (Signed)
Immediate Anesthesia Transfer of Care Note  Patient: Thomas Mcclain  Procedure(s) Performed: ESOPHAGOGASTRODUODENOSCOPY (EGD) WITH PROPOFOL (N/A )  Patient Location: PACU and Endoscopy Unit  Anesthesia Type:MAC  Level of Consciousness: awake, alert  and patient cooperative  Airway & Oxygen Therapy: Patient Spontanous Breathing and Patient connected to face mask oxygen  Post-op Assessment: Report given to RN and Post -op Vital signs reviewed and stable  Post vital signs: Reviewed and stable  Last Vitals:  Vitals Value Taken Time  BP    Temp    Pulse    Resp    SpO2      Last Pain:  Vitals:   02/21/20 1006  TempSrc: Oral  PainSc: 0-No pain         Complications: No complications documented.
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
## 2020-02-21 NOTE — Anesthesia Preprocedure Evaluation (Addendum)
Anesthesia Evaluation  Patient identified by MRN, date of birth, ID band Patient awake    Reviewed: Allergy & Precautions, NPO status , Patient's Chart, lab work & pertinent test results  Airway Mallampati: II  TM Distance: >3 FB Neck ROM: Full    Dental  (+) Dental Advisory Given, Edentulous Lower, Edentulous Upper   Pulmonary former smoker,    Pulmonary exam normal breath sounds clear to auscultation       Cardiovascular hypertension, Normal cardiovascular exam Rhythm:Regular Rate:Normal     Neuro/Psych negative neurological ROS  negative psych ROS   GI/Hepatic Neg liver ROS, hiatal hernia, hematemesis   Endo/Other  negative endocrine ROS  Renal/GU Renal disease     Musculoskeletal  (+) Arthritis ,   Abdominal   Peds  Hematology  (+) Blood dyscrasia, anemia ,   Anesthesia Other Findings Day of surgery medications reviewed with the patient.  Reproductive/Obstetrics                            Anesthesia Physical Anesthesia Plan  ASA: II  Anesthesia Plan: MAC   Post-op Pain Management:    Induction: Intravenous  PONV Risk Score and Plan: 1 and Propofol infusion and Treatment may vary due to age or medical condition  Airway Management Planned: Nasal Cannula and Natural Airway  Additional Equipment:   Intra-op Plan:   Post-operative Plan:   Informed Consent: I have reviewed the patients History and Physical, chart, labs and discussed the procedure including the risks, benefits and alternatives for the proposed anesthesia with the patient or authorized representative who has indicated his/her understanding and acceptance.     Dental advisory given  Plan Discussed with: CRNA and Anesthesiologist  Anesthesia Plan Comments:         Anesthesia Quick Evaluation

## 2020-02-21 NOTE — Op Note (Signed)
[facility] Patient Name: Thomas Mcclain Procedure Date: 02/21/2020 MRN: [id number] Attending MD: Clarene Essex , MD Date of Birth: Nov 23, 1945 CSN: [id number] Age: 75 Admit Type: Inpatient Procedure:                Upper GI endoscopy Indications:              Acute post hemorrhagic anemia, Hematemesis Providers:                Clarene Essex, MD, Carlyn Reichert, RN, Fransico Setters Mbumina,                            Technician Referring MD:              Medicines:                Propofol total dose 160 mg IV, 60 mg IV lidocaine Complications:            No immediate complications. Estimated Blood Loss:     Estimated blood loss: none. Procedure:                Pre-Anesthesia Assessment:                           - Prior to the procedure, a History and Physical                            was performed, and patient medications and                            allergies were reviewed. The patient's tolerance of                            previous anesthesia was also reviewed. The risks                            and benefits of the procedure and the sedation                            options and risks were discussed with the patient.                            All questions were answered, and informed consent                            was obtained. Prior Anticoagulants: The patient has                            taken no previous anticoagulant or antiplatelet                            agents except for NSAID medication. ASA Grade                            Assessment: II - A patient with mild systemic  disease. After reviewing the risks and benefits,                            the patient was deemed in satisfactory condition to                            undergo the procedure.                           After obtaining informed consent, the endoscope was                            passed under direct vision. Throughout the                            procedure,  the patient's blood pressure, pulse, and                            oxygen saturations were monitored continuously. The                            [id number]-H190 ([id number]) Olympus gastroscope was                            introduced through the mouth, and advanced to the                            third part of duodenum. The upper GI endoscopy was                            accomplished without difficulty. The patient                            tolerated the procedure well. Scope In: Scope Out: Findings:      The larynx was normal.      A prior Nissen fundoplication was found at the gastroesophageal junction.      One superficial esophageal ulcer with no stigmata of recent bleeding was       found.      Evidence of a Nissen fundoplication was found in the cardia. The wrap       appeared intact. This was traversed.      The duodenal bulb, first portion of the duodenum, second portion of the       duodenum and third portion of the duodenum were normal.      The exam was otherwise without abnormality. Impression:               - Normal larynx.                           - A Nissen fundoplication was found.                           - Esophageal ulcer with no stigmata of recent  bleeding.                           - A Nissen fundoplication was found. The wrap                            appears intact.                           - Normal duodenal bulb, first portion of the                            duodenum, second portion of the duodenum and third                            portion of the duodenum.                           - The examination was otherwise normal.                           - No specimens collected. Moderate Sedation:      Not Applicable - Patient had care per Anesthesia. Recommendation:           - Soft diet today.                           - Continue present medications. Care with aspirin                            and nonsteroidals twice daily pump  inhibitors for 2                            weeks then can decrease to once a day                           - Return to GI clinic in 4 weeks. Will discuss                            screening colonoscopy on follow-up and recheck                            ulcer at that time to document healing in roughly 2                            to 3 months                           - Telephone GI clinic if symptomatic PRN. Hopefully                            will be able to go home soon Procedure Code(s):        --- Professional ---  86754, Esophagogastroduodenoscopy, flexible,                            transoral; diagnostic, including collection of                            specimen(s) by brushing or washing, when performed                            (separate procedure) Diagnosis Code(s):        --- Professional ---                           G92.010, Other specified postprocedural states                           K22.10, Ulcer of esophagus without bleeding                           D62, Acute posthemorrhagic anemia                           K92.0, Hematemesis CPT copyright 2019 [employer]. All rights reserved. The codes documented in this report are preliminary and upon coder review may  be revised to meet current compliance requirements. Clarene Essex, MD 02/21/2020 10:58:05 AM This report has been signed electronically. Number of Addenda: 0
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
## 2020-02-21 NOTE — Anesthesia Procedure Notes (Signed)
Procedure Name: MAC Date/Time: 02/21/2020 10:29 AM Performed by: West Pugh, CRNA Pre-anesthesia Checklist: Patient identified, Emergency Drugs available, Suction available, Patient being monitored and Timeout performed Patient Re-evaluated:Patient Re-evaluated prior to induction Oxygen Delivery Method: Simple face mask Preoxygenation: Pre-oxygenation with 100% oxygen Induction Type: IV induction Placement Confirmation: positive ETCO2 Dental Injury: Teeth and Oropharynx as per pre-operative assessment

## 2020-02-22 DIAGNOSIS — K922 Gastrointestinal hemorrhage, unspecified: Secondary | ICD-10-CM | POA: Diagnosis not present

## 2020-02-22 LAB — BASIC METABOLIC PANEL
Anion gap: 6 (ref 5–15)
BUN: 12 mg/dL (ref 8–23)
CO2: 25 mmol/L (ref 22–32)
Calcium: 8.2 mg/dL — ABNORMAL LOW (ref 8.9–10.3)
Chloride: 108 mmol/L (ref 98–111)
Creatinine, Ser: 0.82 mg/dL (ref 0.61–1.24)
GFR, Estimated: >60 mL/min (ref >60)
Glucose, Bld: 114 mg/dL — ABNORMAL HIGH (ref 70–99)
Potassium: 4 mmol/L (ref 3.5–5.1)
Sodium: 139 mmol/L (ref 135–145)

## 2020-02-22 LAB — CBC
HCT: 25.5 % — ABNORMAL LOW (ref 39.0–52.0)
Hemoglobin: 8.9 g/dL — ABNORMAL LOW (ref 13.0–17.0)
MCH: 31 pg (ref 26.0–34.0)
MCHC: 34.9 g/dL (ref 30.0–36.0)
MCV: 88.9 fL (ref 80.0–100.0)
Platelets: 169 10*3/uL (ref 150–400)
RBC: 2.87 MIL/uL — ABNORMAL LOW (ref 4.22–5.81)
RDW: 14.9 % (ref 11.5–15.5)
WBC: 7.1 10*3/uL (ref 4.0–10.5)
nRBC: 0 % (ref 0.0–0.2)

## 2020-02-22 MED ORDER — ALUM & MAG HYDROXIDE-SIMETH 200-200-20 MG/5ML PO SUSP
30.0000 mL | ORAL | Status: DC | PRN
  Administered 2020-02-22: 30 mL via ORAL
  Filled 2020-02-22: qty 30

## 2020-02-22 MED ORDER — PANTOPRAZOLE SODIUM 40 MG PO TBEC: 40.0000 mg | DELAYED_RELEASE_TABLET | Freq: Two times a day (BID) | ORAL | 0 refills | Status: AC

## 2020-02-22 MED ORDER — PANTOPRAZOLE SODIUM 40 MG PO TBEC: 40.0000 mg | DELAYED_RELEASE_TABLET | Freq: Every day | ORAL | 0 refills | Status: AC

## 2020-02-22 MED ORDER — ACETAMINOPHEN 325 MG PO TABS
325.0000 mg | ORAL_TABLET | Freq: Four times a day (QID) | ORAL | 0 refills | Status: AC | PRN
Start: 2020-02-22 — End: 2020-05-22

## 2020-02-22 MED ORDER — TRAMADOL HCL 50 MG PO TABS
50.0000 mg | ORAL_TABLET | Freq: Once | ORAL | Status: AC
  Administered 2020-02-22: 50 mg via ORAL
  Filled 2020-02-22: qty 1

## 2020-02-22 NOTE — Evaluation (Signed)
Occupational Therapy Evaluation Patient Details Name: Thomas Mcclain MRN: [id number] DOB: [birth year] Today's Date: 02/22/2020    History of Present Illness Raistlin Gum is a 75 y.o. male with medical history significant of BPH, hypertension, presenting to the ED due to hematemesis and syncope.   Clinical Impression   Patient evaluated by Occupational Therapy with no further acute OT needs identified. All education has been completed and the patient has no further questions.  See below for any follow-up Occupational Therapy or equipment needs. OT is signing off. Thank you for this referral.     Follow Up Recommendations  No OT follow up    Equipment Recommendations  None recommended by OT    Recommendations for Other Services       Precautions / Restrictions Precautions Precautions: None Restrictions Weight Bearing Restrictions: No      Mobility Bed Mobility               General bed mobility comments: out of bed upon arival    Transfers Overall transfer level: Independent Equipment used: None             General transfer comment: Pt ambulated in hallway without AD Mod I to independently. Please refer to PT Evaluation for more details on gait.    Balance Overall balance assessment: Independent                                         ADL either performed or assessed with clinical judgement   ADL Overall ADL's : Modified independent;At baseline                                       General ADL Comments: Pt able to demonstrate donning and doffing socks, standing balance for grooming at sink, and toileting ability all Mod I to Independent.     Vision Patient Visual Report: No change from baseline Vision Assessment?: No apparent visual deficits     Perception     Praxis      Pertinent Vitals/Pain Pain Assessment: No/denies pain     Hand Dominance Right   Extremity/Trunk Assessment Upper Extremity  Assessment Upper Extremity Assessment: Overall WFL for tasks assessed   Lower Extremity Assessment Lower Extremity Assessment: Overall WFL for tasks assessed   Cervical / Trunk Assessment Cervical / Trunk Assessment: Normal   Communication Communication Communication: No difficulties   Cognition Arousal/Alertness: Awake/alert Behavior During Therapy: WFL for tasks assessed/performed Overall Cognitive Status: Within Functional Limits for tasks assessed                                     General Comments       Exercises     Shoulder Instructions      Home Living Family/patient expects to be discharged to:: Private residence Living Arrangements: Spouse/significant other Available Help at Discharge: Family Type of Home: House Home Access: Stairs to enter Technical brewer of Steps: 3 Entrance Stairs-Rails: Can reach both Home Layout: Multi-level Alternate Level Stairs-Number of Steps: 8-9 Alternate Level Stairs-Rails: Right Bathroom Shower/Tub: Occupational psychologist: Standard     Home Equipment: Environmental consultant - 2 wheels;Cane - single point;Shower seat   Additional Comments: DME belongs to pt's wife and  pt has never needed to use.      Prior Functioning/Environment Level of Independence: Independent        Comments: Pt reports independent with ADLs, community ambulation without AD, drives school bus.        OT Problem List: Other (comment) (None)      OT Treatment/Interventions:      OT Goals(Current goals can be found in the care plan section) Acute Rehab OT Goals Patient Stated Goal: return home OT Goal Formulation: With patient  OT Frequency:     Barriers to D/C:            Co-evaluation              AM-PAC OT "6 Clicks" Daily Activity     Outcome Measure Help from another person eating meals?: None Help from another person taking care of personal grooming?: None Help from another person toileting, which includes  using toliet, bedpan, or urinal?: None Help from another person bathing (including washing, rinsing, drying)?: None Help from another person to put on and taking off regular upper body clothing?: None Help from another person to put on and taking off regular lower body clothing?: None 6 Click Score: 24   End of Session Nurse Communication: Mobility status  Activity Tolerance: Patient tolerated treatment well Patient left: in chair;with call bell/phone within reach  OT Visit Diagnosis:  (none)                Time: 6415-8309 OT Time Calculation (min): 12 min Charges:  OT General Charges $OT Visit: 1 Visit OT Evaluation $OT Eval Low Complexity: 1 Low  Stacia Feazell, [place] Office: [phone] 02/22/2020  Julien Girt 02/22/2020, 11:36 AM

## 2020-02-22 NOTE — Discharge Instructions (Signed)
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
## 2020-02-22 NOTE — Evaluation (Addendum)
Physical Therapy Evaluation Only Patient Details Name: Thomas Mcclain MRN: [id number] DOB: Jun 03, 1945 Today's Date: 02/22/2020   History of Present Illness  Thomas Mcclain is a 75 y.o. male with medical history significant of BPH, hypertension, presenting to the ED due to hematemesis and syncope.  Clinical Impression  Pt completes transfers in room and able to ambulate with speed changes and direction changes without LOB. Pt reports feeling back to normal. Pt denies dizziness, SOB, pain with all mobility. No PT needs identified at this time, will sign off.    Follow Up Recommendations No PT follow up    Equipment Recommendations  None recommended by PT    Recommendations for Other Services       Precautions / Restrictions Precautions Precautions: None Restrictions Weight Bearing Restrictions: No      Mobility  Bed Mobility  General bed mobility comments: out of bed upon arival    Transfers Overall transfer level: Independent Equipment used: None  General transfer comment: pt completes STS transfers from EOB with light UE assist, no unsteadiness  Ambulation/Gait Ambulation/Gait assistance: Independent Gait Distance (Feet): 200 Feet Assistive device: None Gait Pattern/deviations: WFL(Within Functional Limits)     General Gait Details: Pt ambulates around room and in hallway, completes 180 degree turns, changes speed and directions without LOB  Stairs            Wheelchair Mobility    Modified Rankin (Stroke Patients Only)       Balance Overall balance assessment: Independent       Pertinent Vitals/Pain Pain Assessment: No/denies pain    Home Living Family/patient expects to be discharged to:: Private residence Living Arrangements: Spouse/significant other Available Help at Discharge: Family Type of Home: House Home Access: Stairs to enter Entrance Stairs-Rails: Can reach both Entrance Stairs-Number of Steps: 3 Home Layout: Multi-level Home  Equipment: Walker - 2 wheels;Cane - single point;Shower seat      Prior Function Level of Independence: Independent         Comments: Pt reports independent with ADLs, community ambulation without AD, drives school bus.     Hand Dominance   Dominant Hand: Right    Extremity/Trunk Assessment   Upper Extremity Assessment Upper Extremity Assessment: Defer to OT evaluation    Lower Extremity Assessment Lower Extremity Assessment: Overall WFL for tasks assessed (AROM WNL, strength 4+/5 throughout, denies numbness/tingling)    Cervical / Trunk Assessment Cervical / Trunk Assessment: Normal  Communication   Communication: No difficulties  Cognition Arousal/Alertness: Awake/alert Behavior During Therapy: WFL for tasks assessed/performed Overall Cognitive Status: Within Functional Limits for tasks assessed     General Comments      Exercises     Assessment/Plan    PT Assessment Patent does not need any further PT services  PT Problem List         PT Treatment Interventions      PT Goals (Current goals can be found in the Care Plan section)  Acute Rehab PT Goals Patient Stated Goal: return home PT Goal Formulation: With patient Time For Goal Achievement: 02/22/20 Potential to Achieve Goals: Good    Frequency     Barriers to discharge        Co-evaluation               AM-PAC PT "6 Clicks" Mobility  Outcome Measure Help needed turning from your back to your side while in a flat bed without using bedrails?: None Help needed moving from lying on your back to sitting  on the side of a flat bed without using bedrails?: None Help needed moving to and from a bed to a chair (including a wheelchair)?: None Help needed standing up from a chair using your arms (e.g., wheelchair or bedside chair)?: None Help needed to walk in hospital room?: None Help needed climbing 3-5 steps with a railing? : None 6 Click Score: 24    End of Session   Activity Tolerance:  Patient tolerated treatment well Patient left: with call bell/phone within reach;Other (comment) (in restroom) Nurse Communication: Mobility status PT Visit Diagnosis: Other abnormalities of gait and mobility (R26.89)    Time: 4097-3532 PT Time Calculation (min) (ACUTE ONLY): 14 min   Charges:   PT Evaluation $PT Eval Low Complexity: 1 Low           Tori Ourania Hamler PT, DPT 02/22/20, 11:06 AM

## 2020-02-22 NOTE — Progress Notes (Signed)
Went over discharge paperwork with patient and family.  All questions answered.  VSS.

## 2020-02-23 NOTE — Anesthesia Postprocedure Evaluation (Signed)
Anesthesia Post Note  Patient: Jimie Urbani  Procedure(s) Performed: ESOPHAGOGASTRODUODENOSCOPY (EGD) WITH PROPOFOL (N/A )     Patient location during evaluation: Endoscopy Anesthesia Type: MAC Level of consciousness: awake and alert Pain management: pain level controlled Vital Signs Assessment: post-procedure vital signs reviewed and stable Respiratory status: spontaneous breathing, nonlabored ventilation, respiratory function stable and patient connected to nasal cannula oxygen Cardiovascular status: stable and blood pressure returned to baseline Postop Assessment: no apparent nausea or vomiting Anesthetic complications: no   No complications documented.  Last Vitals:  Vitals:   02/22/20 1302 02/22/20 1302  BP:  135/83  Pulse:  75  Resp:  16  Temp: 36.5 C   SpO2:  99%    Last Pain:  Vitals:   02/22/20 1302  TempSrc: Oral  PainSc:                  Catalina Gravel

## 2020-02-24 LAB — BPAM RBC
Blood Product Expiration Date: 202203122359
Blood Product Expiration Date: 202203162359
Blood Product Expiration Date: 202203162359
Blood Product Expiration Date: 202203162359
ISSUE DATE / TIME: 202202172300
ISSUE DATE / TIME: 202202180324
Unit Type and Rh: 7300
Unit Type and Rh: 7300
Unit Type and Rh: 7300
Unit Type and Rh: 7300

## 2020-02-24 LAB — TYPE AND SCREEN
ABO/RH(D): B POS
Antibody Screen: NEGATIVE
Unit division: 0
Unit division: 0
Unit division: 0
Unit division: 0

## 2020-02-25 ENCOUNTER — Encounter (HOSPITAL_COMMUNITY): Payer: Self-pay | Admitting: Gastroenterology

## 2020-07-15 ENCOUNTER — Other Ambulatory Visit (HOSPITAL_COMMUNITY): Payer: Self-pay

## 2020-11-18 ENCOUNTER — Other Ambulatory Visit: Payer: Self-pay

## 2020-11-18 ENCOUNTER — Ambulatory Visit
Admission: EM | Admit: 2020-11-18 | Discharge: 2020-11-18 | Disposition: A | Discharge status: Home / Self Care | Payer: Medicare (Managed Care) | Attending: Physician Assistant | Admitting: Physician Assistant

## 2020-11-18 DIAGNOSIS — J069 Acute upper respiratory infection, unspecified: Secondary | ICD-10-CM | POA: Diagnosis not present

## 2020-11-18 LAB — POCT INFLUENZA A/B
Influenza A, POC: NEGATIVE
Influenza B, POC: NEGATIVE

## 2020-11-18 MED ORDER — OSELTAMIVIR PHOSPHATE 75 MG PO CAPS: 75.0000 mg | ORAL_CAPSULE | Freq: Two times a day (BID) | ORAL | 0 refills | Status: DC
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
## 2020-11-18 NOTE — ED Triage Notes (Signed)
Pt c/o cough, fever, congestion, body aches, and ear pain since Monday.

## 2020-11-19 LAB — COVID-19, FLU A+B NAA
Influenza A, NAA: NOT DETECTED
Influenza B, NAA: NOT DETECTED
SARS-CoV-2, NAA: NOT DETECTED

## 2021-05-03 ENCOUNTER — Other Ambulatory Visit: Payer: Self-pay | Admitting: Family Medicine

## 2021-05-03 DIAGNOSIS — R0602 Shortness of breath: Secondary | ICD-10-CM

## 2021-05-04 ENCOUNTER — Ambulatory Visit
Admission: RE | Admit: 2021-05-04 | Discharge: 2021-05-04 | Disposition: A | Discharge status: Home / Self Care | Payer: Medicare PPO | Source: Ambulatory Visit | Attending: Family Medicine | Admitting: Family Medicine

## 2021-05-04 DIAGNOSIS — R0602 Shortness of breath: Secondary | ICD-10-CM

## 2021-07-15 IMAGING — CT CT CERVICAL SPINE W/O CM
3 of 4 series · 13 of 33 positions shown, 16 images · non-contrast
Comparison: 07/14/2018

CLINICAL DATA: Fall with trauma to the head and neck.

EXAM:
CT HEAD WITHOUT CONTRAST
CT CERVICAL SPINE WITHOUT CONTRAST
TECHNIQUE: Multidetector CT imaging of the head and cervical spine was
performed following the standard protocol without intravenous
contrast. Multiplanar CT image reconstructions of the cervical spine
were also generated.

[Series 5: orthogonal bone · axial · 0.27mm/px · z∈[-209,-71]mm · 5 of 122 slices shown, 7 images]
[im 21/122  soft-tissue]
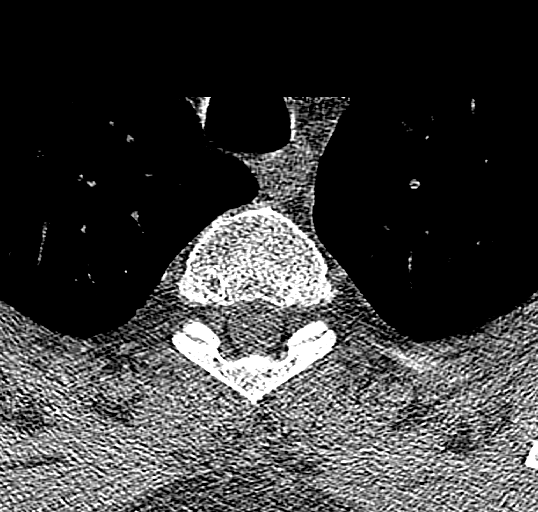
[im 21/122  bone]
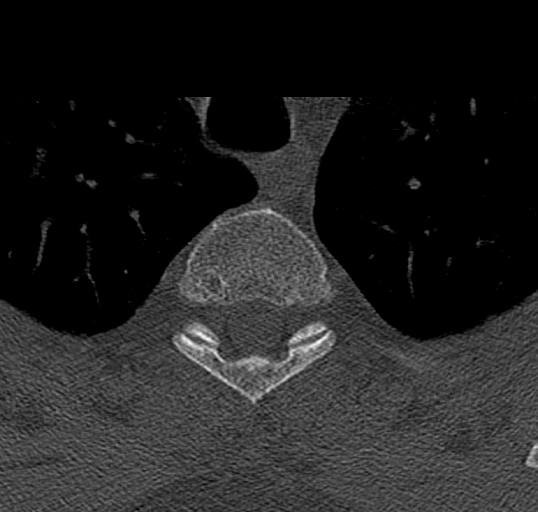
[im 41/122  bone]
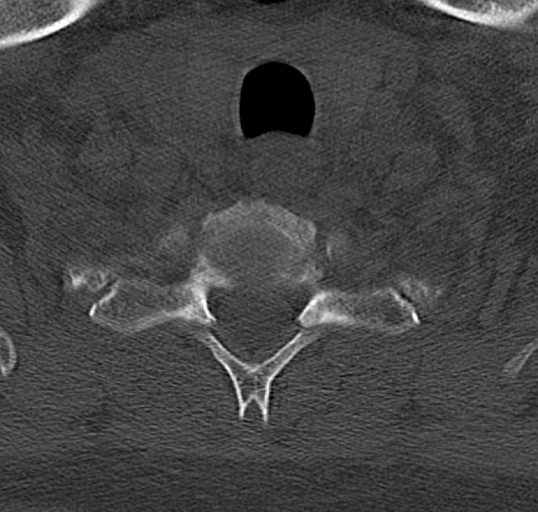
[im 61/122  bone]
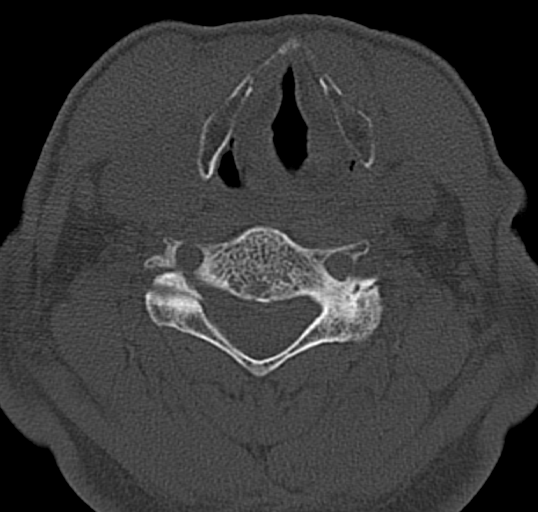
[im 81/122  bone]
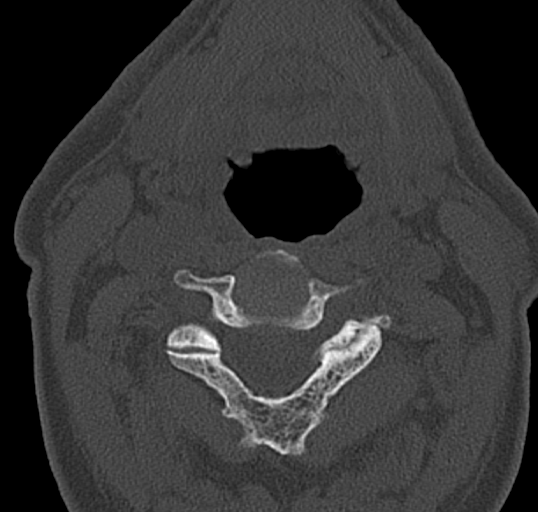
[im 101/122  soft-tissue]
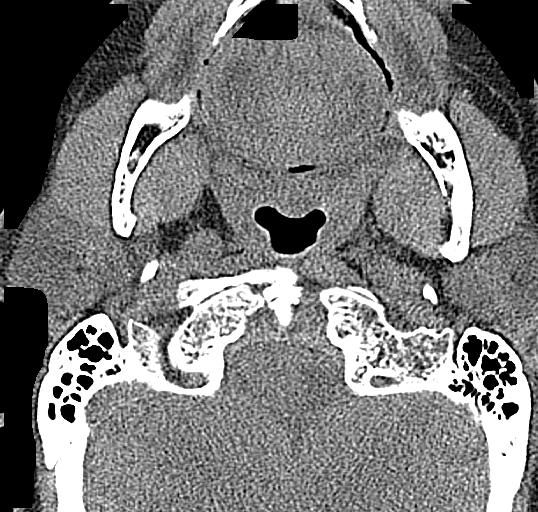
[im 101/122  bone]
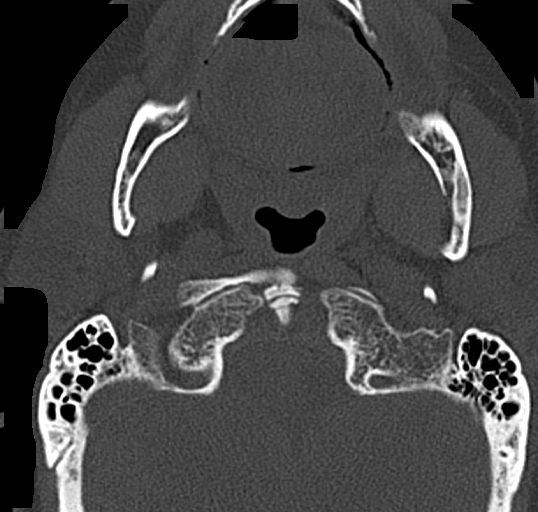

[Series 6: coronal bone · coronal · 0.32mm/px · 3 of 76 slices shown]
[im 17/76  bone]
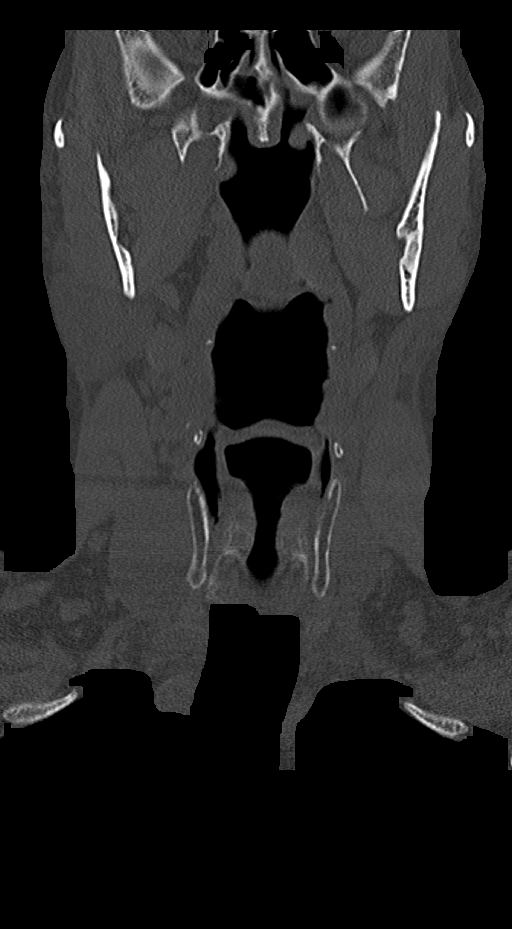
[im 31/76  bone]
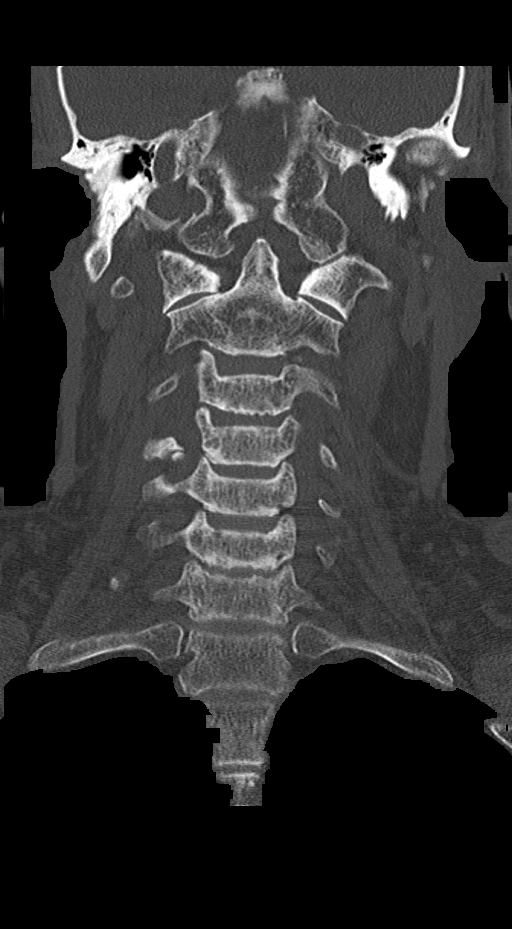
[im 45/76  bone]
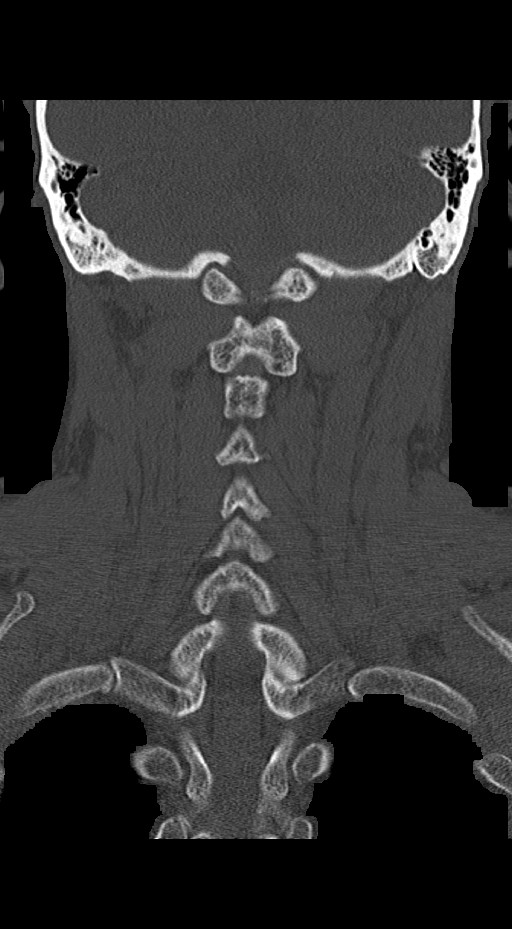

[Series 7: sagittal bone · sagittal · 0.48mm/px · 5 of 73 slices shown, 6 images]
[im 25/73  bone]
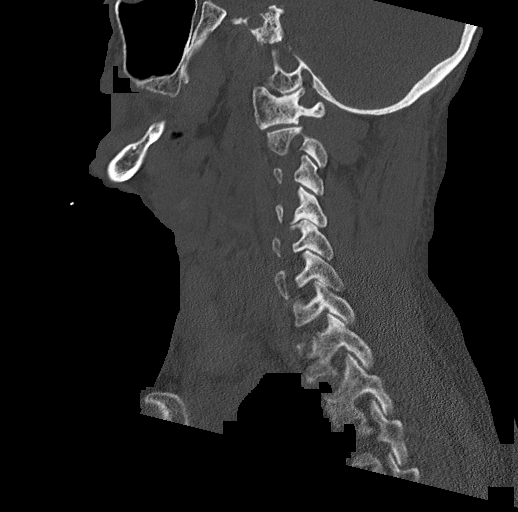
[im 31/73  bone]
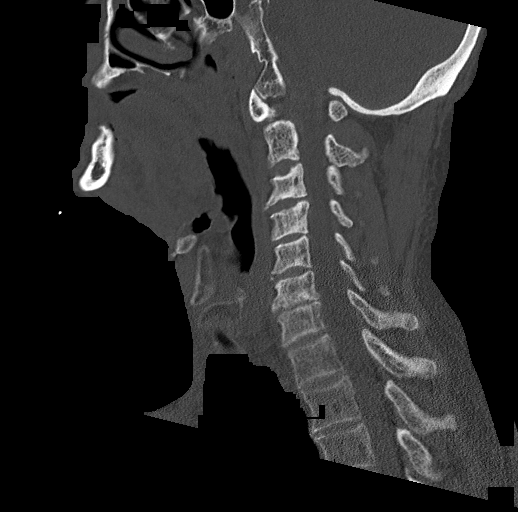
[im 37/73  soft-tissue]
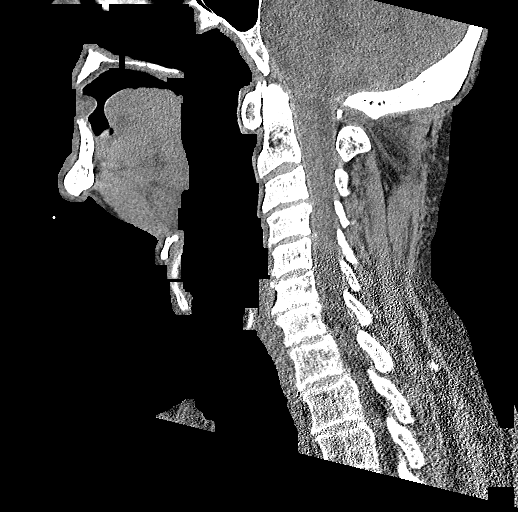
[im 37/73  bone]
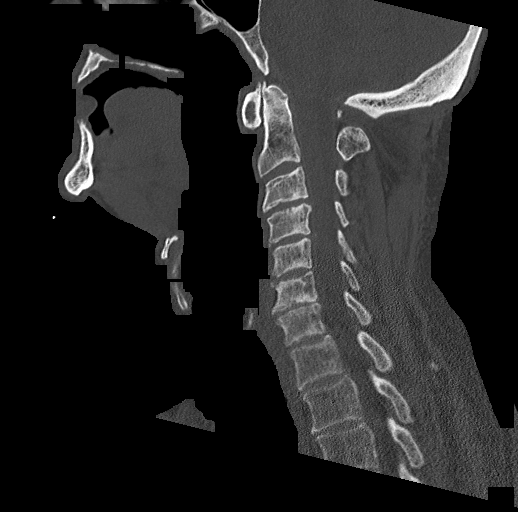
[im 43/73  bone]
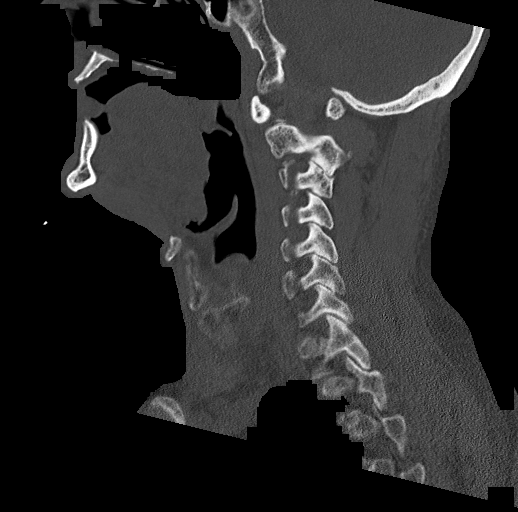
[im 49/73  bone]
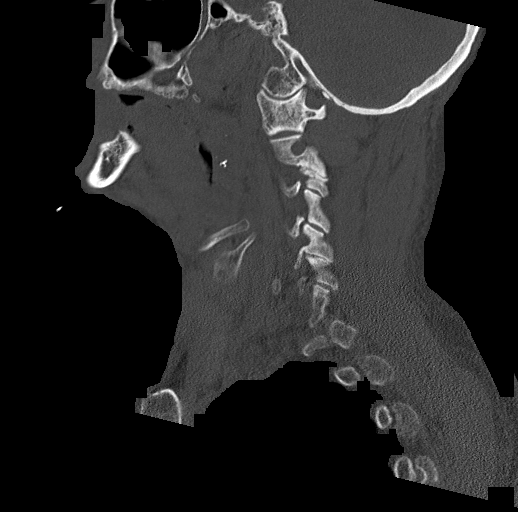

[13 of 33 positions shown; findings below may reference images not displayed]

FINDINGS: CT HEAD FINDINGS

Brain: Age related atrophy. Mild chronic small-vessel change of the
hemispheric white matter. No sign of acute infarction, mass lesion,
hemorrhage, hydrocephalus or extra-axial collection.

Vascular: There is atherosclerotic calcification of the major
vessels at the base of the brain.

Skull: No skull fracture.

Sinuses/Orbits: Sinuses are clear.  Orbits are negative.

Other: None

CT CERVICAL SPINE FINDINGS

Alignment: Straightening of the normal cervical lordosis. No
traumatic malalignment.

Skull base and vertebrae: No fracture or focal bone lesion.

Soft tissues and spinal canal: No soft tissue injury.

Disc levels: Mild spondylosis from C3-4 through C6-7. No significant
osteophytic encroachment upon the canal or foramina.

Upper chest: Negative

Other: None
IMPRESSION: HEAD CT:

No acute or traumatic finding. Age related atrophy and mild chronic
small-vessel change of the white matter.

CERVICAL SPINE CT:

No acute or traumatic finding. Mild spondylosis.

## 2021-07-15 IMAGING — CT CT CTA ABD/PEL W/CM AND/OR W/O CM
3 of 12 series · 11 of 46 positions shown, 16 images · IV contrast (omnipaque)
Comparison: None.

CLINICAL DATA: Post fall, assisted fall by home nurse. Pt with
possible GI bleed, pt vomited blood tonight.

EXAM:
CTA ABDOMEN AND PELVIS WITHOUT AND WITH CONTRAST
TECHNIQUE: Multidetector CT imaging of the abdomen and pelvis was performed
using the standard protocol during bolus administration of
intravenous contrast. Multiplanar reconstructed images and MIPs were
obtained and reviewed to evaluate the vascular anatomy.
CONTRAST:  100mL OMNIPAQUE IOHEXOL 350 MG/ML SOLN

[Series 7: axial arterial · axial · arterial · 0.84mm/px · z∈[-766,-492]mm · 6 of 252 slices shown]
[im 23/252  soft-tissue]
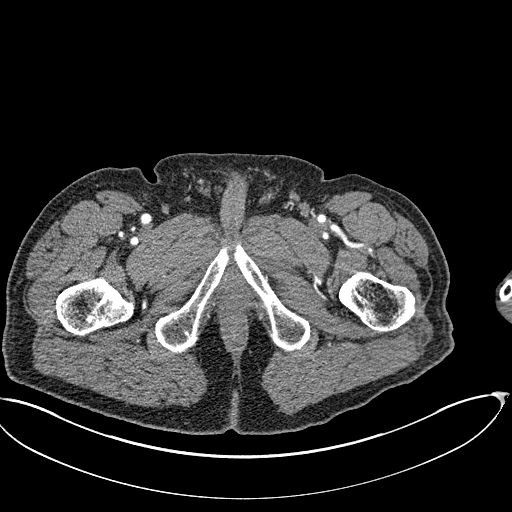
[im 46/252  soft-tissue]
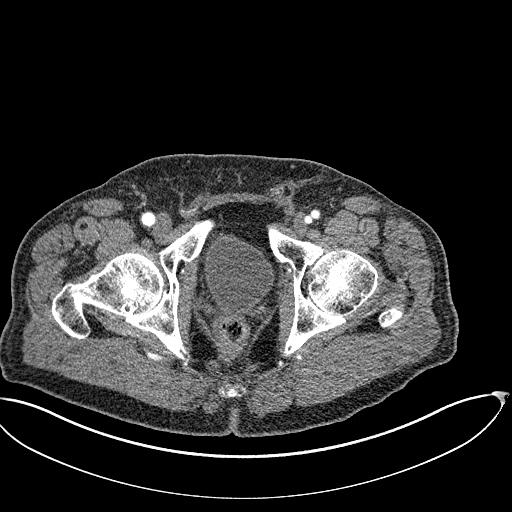
[im 92/252  soft-tissue]
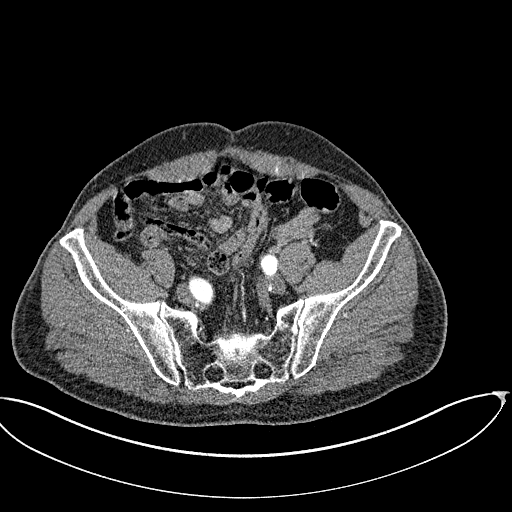
[im 115/252  soft-tissue]
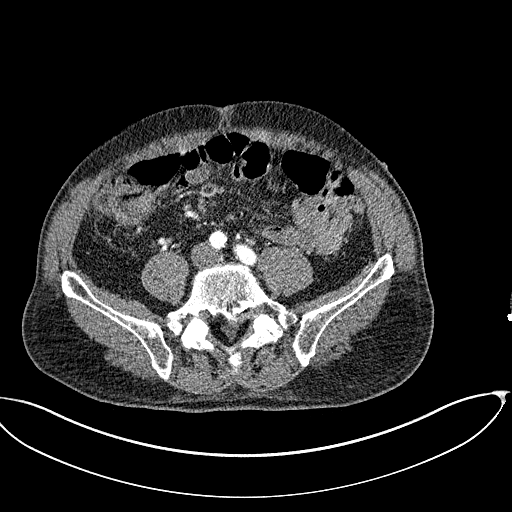
[im 137/252  soft-tissue]
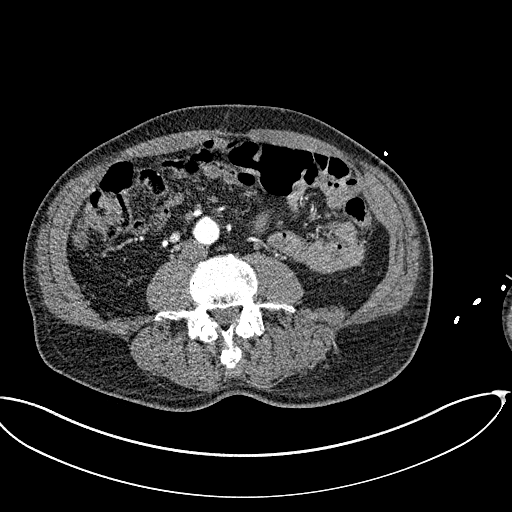
[im 160/252  soft-tissue]
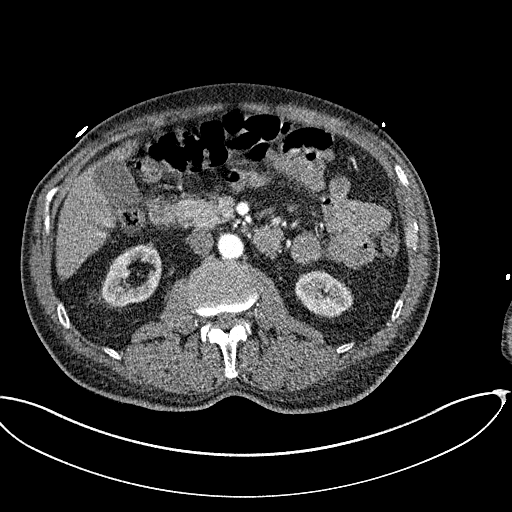

[Series 8: coronal arterial · coronal · arterial · 0.78mm/px · 2 of 152 slices shown, 3 images]
[im 51/152  soft-tissue]
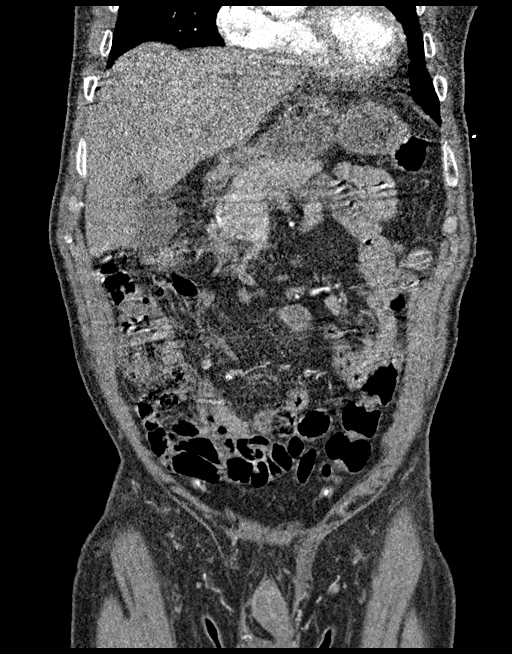
[im 51/152  bone]
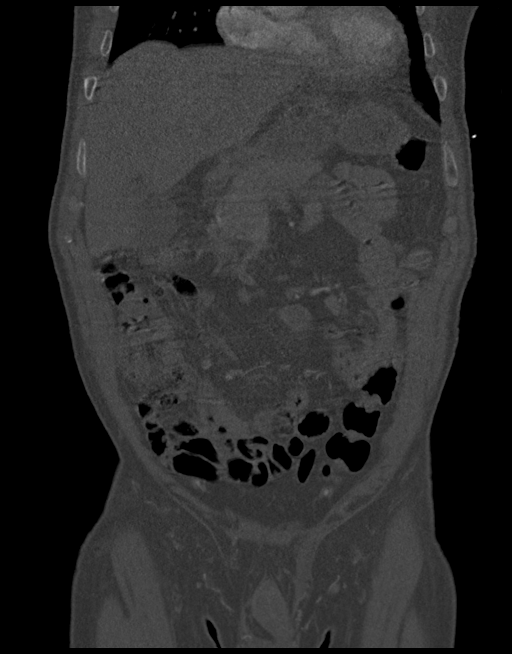
[im 101/152  soft-tissue]
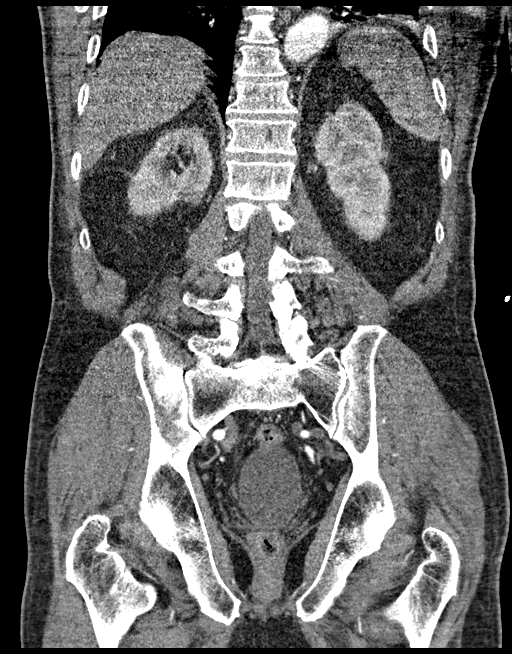

[Series 14: axial portal venous · axial · portal-venous · 0.86mm/px · z∈[-683,-433]mm · 3 of 101 slices shown, 7 images]
[im 26/101  soft-tissue]
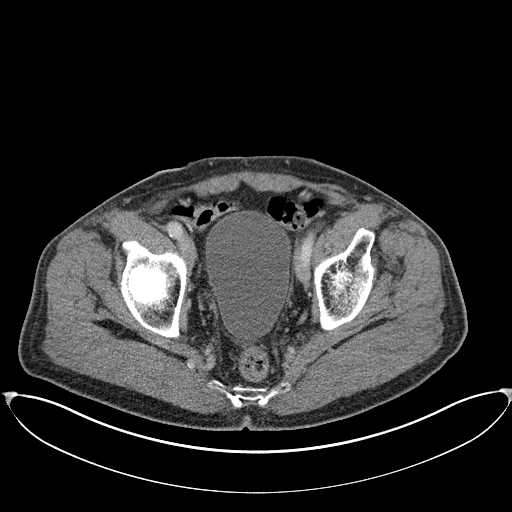
[im 26/101  lung]
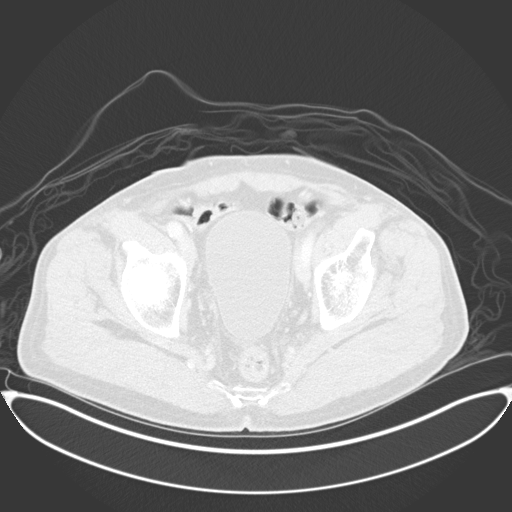
[im 26/101  bone]
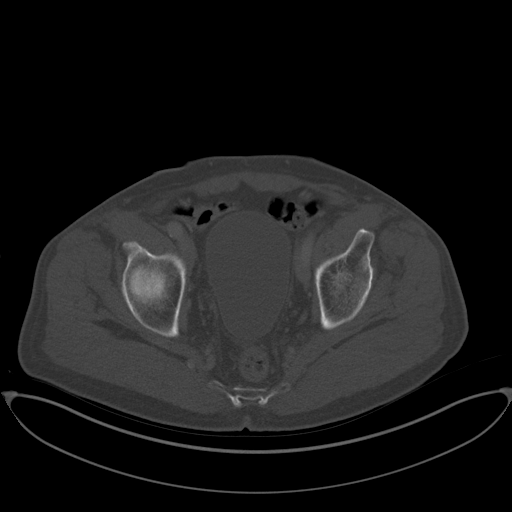
[im 51/101  soft-tissue]
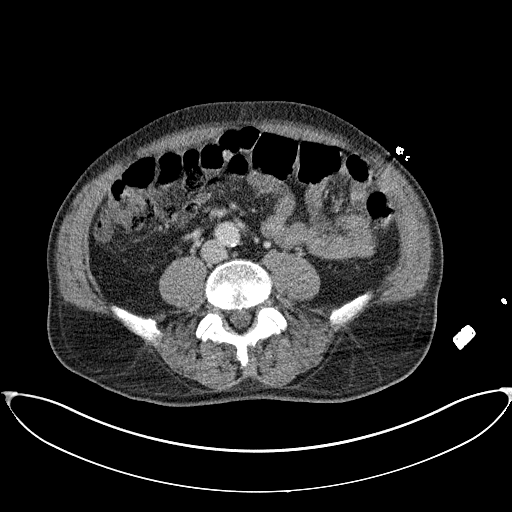
[im 51/101  lung]
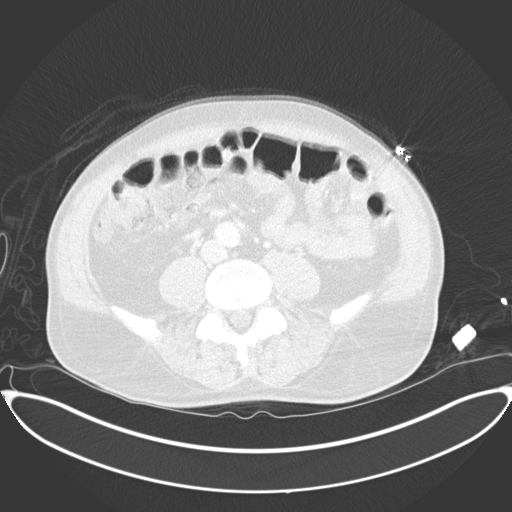
[im 76/101  soft-tissue]
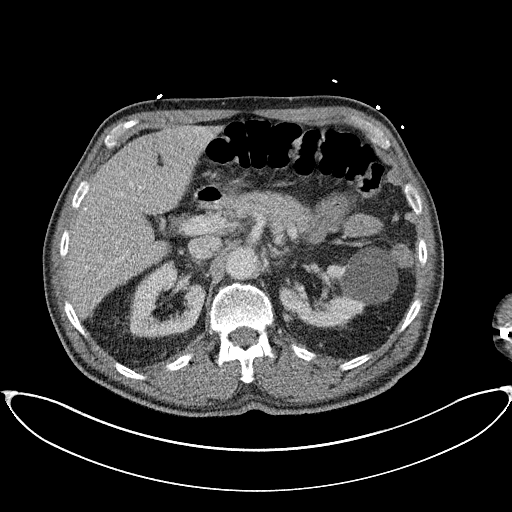
[im 76/101  lung]
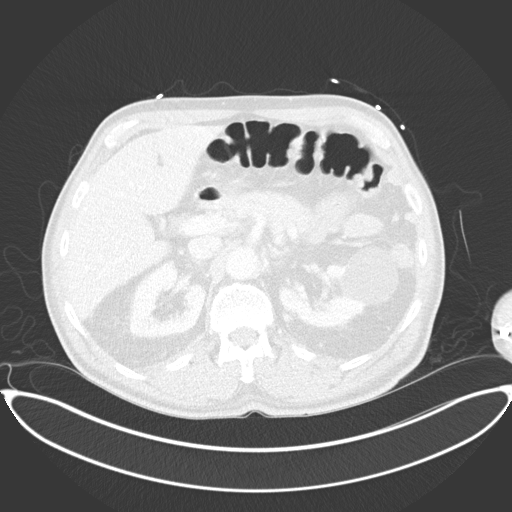

[11 of 46 positions shown; findings below may reference images not displayed]

FINDINGS: VASCULAR

Aorta: Mild atherosclerotic plaque. Normal caliber aorta without
aneurysm, dissection, vasculitis or significant stenosis.

Celiac: Patent without evidence of aneurysm, dissection, vasculitis
or significant stenosis.

SMA: Patent without evidence of aneurysm, dissection, vasculitis or
significant stenosis.

Renals: Both renal arteries are patent without evidence of aneurysm,
dissection, vasculitis, fibromuscular dysplasia or significant
stenosis.

IMA: Patent without evidence of aneurysm, dissection, vasculitis or
significant stenosis.

Inflow: Mild atherosclerotic plaque. Patent without evidence of
aneurysm, dissection, vasculitis or significant stenosis.

Proximal Outflow: Bilateral common femoral and visualized portions
of the superficial and profunda femoral arteries are patent without
evidence of aneurysm, dissection, vasculitis or significant
stenosis.

Veins: No portal, splenic, superior mesenteric venous thrombosis.

Review of the MIP images confirms the above findings.

NON-VASCULAR

Lower chest: Bilateral lower lobe atelectasis versus scarring. Left
base bronchiectasis. At least small hiatal hernia. Suggestion of
possible gastric surgical changes.

Liver: Not enlarged. No focal lesion. No laceration or subcapsular
hematoma.

Biliary System: The gallbladder is otherwise unremarkable with no
radio-opaque gallstones. No biliary ductal dilatation.

Pancreas: Normal pancreatic contour. No main pancreatic duct
dilatation.

Spleen: Not enlarged. No focal lesion. No laceration, subcapsular
hematoma, or vascular injury.

Adrenal Glands: No nodularity bilaterally.

Kidneys:

Bilateral kidneys enhance symmetrically. No hydronephrosis. No
contusion, laceration, or subcapsular hematoma. A 5 cm fluid density
lesion within left kidney likely represents a simple renal cyst.

No injury to the vascular structures or collecting systems. No
hydroureter.

The urinary bladder is unremarkable.

Bowel: No small or large bowel wall thickening or dilatation. No
pneumatosis. Under distending rectosigmoid colon. The appendix is
unremarkable.

Mesentery, Omentum, and Peritoneum: No simple free fluid ascites. No
pneumoperitoneum. No hemoperitoneum. No mesenteric hematoma
identified. No organized fluid collection.

Pelvic Organs: Normal.

Lymph Nodes: No abdominal, pelvic, inguinal lymphadenopathy.

Vasculature: No abdominal aorta or iliac aneurysm. No active
contrast extravasation or pseudoaneurysm.

Musculoskeletal:

No significant soft tissue hematoma.

No acute pelvic fracture. No spinal fracture. Multilevel
degenerative changes of the spine that are most prominent at the
L5-S1 level.
IMPRESSION: VASCULAR

No acute vascular abnormality including no CT findings to suggest a
gastrointestinal hemorrhage.

NON-VASCULAR

No acute intra-abdominal or intrapelvic abnormality in a patient
with at least small volume hiatal hernia with suggestion of
associated surgical changes that are poorly visualized.

## 2021-08-28 ENCOUNTER — Encounter (HOSPITAL_COMMUNITY): Payer: Self-pay

## 2021-08-28 ENCOUNTER — Emergency Department (HOSPITAL_COMMUNITY)
Admission: EM | Admit: 2021-08-28 | Discharge: 2021-08-28 | Disposition: A | Discharge status: Home / Self Care | Payer: Medicare HMO | Attending: Emergency Medicine | Admitting: Emergency Medicine

## 2021-08-28 ENCOUNTER — Emergency Department (HOSPITAL_COMMUNITY): Payer: Medicare HMO

## 2021-08-28 DIAGNOSIS — Z2914 Encounter for prophylactic rabies immune globin: Secondary | ICD-10-CM | POA: Diagnosis not present

## 2021-08-28 DIAGNOSIS — Z203 Contact with and (suspected) exposure to rabies: Secondary | ICD-10-CM | POA: Insufficient documentation

## 2021-08-28 DIAGNOSIS — W540XXA Bitten by dog, initial encounter: Secondary | ICD-10-CM | POA: Insufficient documentation

## 2021-08-28 DIAGNOSIS — S51831A Puncture wound without foreign body of right forearm, initial encounter: Secondary | ICD-10-CM | POA: Diagnosis not present

## 2021-08-28 DIAGNOSIS — Z23 Encounter for immunization: Secondary | ICD-10-CM | POA: Diagnosis not present

## 2021-08-28 DIAGNOSIS — S59911A Unspecified injury of right forearm, initial encounter: Secondary | ICD-10-CM | POA: Diagnosis present

## 2021-08-28 MED ORDER — RABIES VACCINE, PCEC IM SUSR
1.0000 mL | Freq: Once | INTRAMUSCULAR | Status: AC
  Administered 2021-08-28: 1 mL via INTRAMUSCULAR
  Filled 2021-08-28 (×2): qty 1

## 2021-08-28 MED ORDER — AMOXICILLIN-POT CLAVULANATE 875-125 MG PO TABS: 1.0000 | ORAL_TABLET | Freq: Two times a day (BID) | ORAL | 0 refills | Status: DC

## 2021-08-28 MED ORDER — RABIES IMMUNE GLOBULIN 150 UNIT/ML IM INJ
20.0000 [IU]/kg | INJECTION | Freq: Once | INTRAMUSCULAR | Status: AC
  Administered 2021-08-28: 1875 [IU] via INTRAMUSCULAR
  Filled 2021-08-28: qty 20

## 2021-08-28 MED ORDER — BACITRACIN ZINC 500 UNIT/GM EX OINT
TOPICAL_OINTMENT | CUTANEOUS | Status: AC
  Filled 2021-08-28: qty 1.8

## 2021-08-28 MED ORDER — AMOXICILLIN-POT CLAVULANATE 875-125 MG PO TABS
1.0000 | ORAL_TABLET | Freq: Once | ORAL | Status: AC
  Administered 2021-08-28: 1 via ORAL
  Filled 2021-08-28: qty 1

## 2021-08-28 MED ORDER — TETANUS-DIPHTH-ACELL PERTUSSIS 5-2.5-18.5 LF-MCG/0.5 IM SUSY
0.5000 mL | PREFILLED_SYRINGE | Freq: Once | INTRAMUSCULAR | Status: AC
Start: 2021-08-28 — End: 2021-08-28
  Administered 2021-08-28: 0.5 mL via INTRAMUSCULAR
  Filled 2021-08-28: qty 0.5

## 2021-08-28 NOTE — ED Triage Notes (Signed)
Pt presents with c/o dog bite to his right arm. Pt reports that it was his neighbor's dog, neighbor reports the dog is up to date on vaccinations. Bites to left arm, bleeding controlled, arm does have some swelling. Pt also has an abrasion to his right knee, reports he fell right after it happened.

## 2021-08-28 NOTE — ED Provider Notes (Signed)
[facility] DEPT Provider Note   CSN: [id number] Arrival date & time: 08/28/21  3244     History  Chief Complaint  Patient presents with   Animal Bite    Thomas Mcclain is a 76 y.o. male presenting after dog bite that occurred yesterday.  Reports that his neighbors dog bit him through his forearm.  He "felt it hit his bone."  Neighbor reports that dog is vaccinated however no proof of this has been provided.  Starting to note watery discharge.   Animal Bite      Home Medications Prior to Admission medications   Medication Sig Start Date End Date Taking? Authorizing Provider  amoxicillin-clavulanate (AUGMENTIN) 875-125 MG tablet Take 1 tablet by mouth every 12 (twelve) hours. 08/28/21  Yes Cyndi Montejano A, PA-C  diphenhydrAMINE (SOMINEX) 25 MG tablet Take 25 mg by mouth at bedtime as needed for allergies or sleep.    [provider]  ferrous sulfate 325 (65 FE) MG tablet Take 325 mg daily by mouth.    [provider]  loperamide (IMODIUM A-D) 2 MG tablet Take 2 mg by mouth 4 (four) times daily as needed for diarrhea or loose stools.    [provider]  Multiple Vitamin (MULTIVITAMIN WITH MINERALS) TABS tablet Take 1 tablet by mouth daily.    [provider]  Omega-3 1000 MG CAPS Take 1 capsule by mouth daily.    [provider]  oseltamivir (TAMIFLU) 75 MG capsule Take 1 capsule (75 mg total) by mouth every 12 (twelve) hours. 11/18/20   Francene Finders, PA-C  pantoprazole (PROTONIX) 40 MG tablet Take 1 tablet (40 mg total) by mouth 2 (two) times daily before a meal for 14 days. 02/22/20 03/07/20  Kayleen Memos, DO  pantoprazole (PROTONIX) 40 MG tablet Take 1 tablet (40 mg total) by mouth daily. 03/07/20 06/05/20  Kayleen Memos, DO  Propylene Glycol (SYSTANE BALANCE OP) Place 1 drop into both eyes daily as needed (dry eyes).    [provider]  vitamin B-12 (CYANOCOBALAMIN) 1000 MCG tablet Take 1,000  mcg daily by mouth.    [provider]      Allergies    Patient has no known allergies.    Review of Systems   Review of Systems  Physical Exam Updated Vital Signs BP (!) 144/103   Pulse 83   Temp 98 F (36.7 C) (Oral)   Resp 18   Wt 92.3 kg   SpO2 97%   BMI 27.60 kg/m  Physical Exam Vitals and nursing note reviewed.  Constitutional:      Appearance: Normal appearance.  HENT:     Head: Normocephalic and atraumatic.  Eyes:     General: No scleral icterus.    Conjunctiva/sclera: Conjunctivae normal.  Pulmonary:     Effort: Pulmonary effort is normal. No respiratory distress.  Musculoskeletal:     Comments: Full range of motion of right wrist, elbow and shoulder.  2 puncture wounds to the dorsal right forearm that extend through to the volar surface.  Small amounts of purulent drainage from the dorsal surface of his arm.  Skin:    Findings: No rash.  Neurological:     Mental Status: He is alert.  Psychiatric:        Mood and Affect: Mood normal.     ED Results / Procedures / Treatments   Labs (all labs ordered are listed, but only abnormal results are displayed) Labs Reviewed - No data  to display  EKG None  Radiology DG Forearm Right  Result Date: 08/28/2021 CLINICAL DATA:  Dog bite EXAM: RIGHT FOREARM - 2 VIEW COMPARISON:  None Available. FINDINGS: No acute fracture or opaque foreign body. Radial artery calcification. Limited frontal view due to obliquity of the wrist. IMPRESSION: Negative for fracture or opaque foreign body. Electronically Signed   By: Jorje Guild M.D.   On: 08/28/2021 11:31    Procedures Procedures   Medications Ordered in ED Medications  rabies immune globulin (HYPERRAB/KEDRAB) injection 1,875 Units (has no administration in time range)  rabies vaccine (RABAVERT) injection 1 mL (has no administration in time range)  Tdap (BOOSTRIX) injection 0.5 mL (0.5 mLs Intramuscular Given 08/28/21 1051)  amoxicillin-clavulanate  (AUGMENTIN) 875-125 MG per tablet 1 tablet (1 tablet Oral Given 08/28/21 1050)    ED Course/ Medical Decision Making/ A&P                           Medical Decision Making Amount and/or Complexity of Data Reviewed Radiology: ordered.  Risk Prescription drug management.   76 year old male presenting after dog bite.  This occurred yesterday.  Question if the dog is actually vaccinated.  Treatment: Tetanus shot given, first round of rabies vaccines as well as first dose of Augmentin.  Rest of Augmentin sent to his pharmacy.  Imaging: Forearm x-ray ordered, viewed and interpreted by me.  I agree with the radiologist that there are no foreign bodies or other concerning findings on x-ray.  MDM/disposition: Patient has full range of motion of all of his joints.  No bleeding from the puncture wounds.  No suturing is indicated at this time.  He will return in continue the rabies series and take the antibiotics that were sent to the pharmacy.  He is in agreements.  Will return with any signs of systemic illness.  His wife is at the bedside and she is agreeable to this plan as well.   Final Clinical Impression(s) / ED Diagnoses Final diagnoses:  Dog bite, initial encounter    Rx / DC Orders ED Discharge Orders          Ordered    amoxicillin-clavulanate (AUGMENTIN) 875-125 MG tablet  Every 12 hours        08/28/21 1034           Results and diagnoses were explained to the patient. Return precautions discussed in full. Patient had no additional questions and expressed complete understanding.   This chart was dictated using voice recognition software.  Despite best efforts to proofread,  errors can occur which can change the documentation meaning.     Rhae Hammock, PA-C 08/28/21 1143    Ottie Glazier, DO 08/28/21 1551

## 2021-08-28 NOTE — Discharge Instructions (Signed)
Your x-ray is normal.  Read the information about dog bites attached to these discharge papers.  Follow-up per the schedule attached to these papers as well.  Return with any fevers, chills, worse drainage or other concerns.  You may use bacitracin ointment on the wound as well.  It was a pleasure to meet you and I hope that you feel better!

## 2022-01-09 ENCOUNTER — Ambulatory Visit
Admission: EM | Admit: 2022-01-09 | Discharge: 2022-01-09 | Disposition: A | Discharge status: Home / Self Care | Payer: BC Managed Care – PPO | Attending: Nurse Practitioner | Admitting: Nurse Practitioner

## 2022-01-09 DIAGNOSIS — R051 Acute cough: Secondary | ICD-10-CM | POA: Insufficient documentation

## 2022-01-09 DIAGNOSIS — B309 Viral conjunctivitis, unspecified: Secondary | ICD-10-CM | POA: Insufficient documentation

## 2022-01-09 DIAGNOSIS — Z1152 Encounter for screening for COVID-19: Secondary | ICD-10-CM | POA: Diagnosis not present

## 2022-01-09 DIAGNOSIS — B349 Viral infection, unspecified: Secondary | ICD-10-CM | POA: Diagnosis present

## 2022-01-09 MED ORDER — BENZONATATE 100 MG PO CAPS: 100.0000 mg | ORAL_CAPSULE | Freq: Three times a day (TID) | ORAL | 0 refills | Status: AC

## 2022-01-09 NOTE — ED Triage Notes (Signed)
Pt presents with bilateral with bilateral eye drainage, nasal drainage, cough, sore & dry throat, and sinus congestion X 4 days.
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
## 2022-01-09 NOTE — Discharge Instructions (Signed)
Tessalon as needed for cough Over-the-counter allergy eyedrop such as Clear Eyes as needed Over-the-counter Tylenol or ibuprofen as needed for headache Rest and fluids Please follow-up with your PCP 2 to 3 days for recheck Please go to the emergency room for any worsening symptoms

## 2022-01-10 LAB — SARS CORONAVIRUS 2 (TAT 6-24 HRS): SARS Coronavirus 2: NEGATIVE

## 2022-03-04 ENCOUNTER — Other Ambulatory Visit: Payer: Self-pay | Admitting: Family Medicine

## 2022-03-04 DIAGNOSIS — R109 Unspecified abdominal pain: Secondary | ICD-10-CM

## 2022-03-07 ENCOUNTER — Inpatient Hospital Stay: Admission: RE | Admit: 2022-03-07 | Payer: Medicare PPO | Source: Ambulatory Visit

## 2022-03-08 ENCOUNTER — Other Ambulatory Visit: Payer: Medicare PPO

## 2022-03-08 ENCOUNTER — Ambulatory Visit
Admission: RE | Admit: 2022-03-08 | Discharge: 2022-03-08 | Disposition: A | Discharge status: Home / Self Care | Payer: Medicare PPO | Source: Ambulatory Visit | Attending: Family Medicine | Admitting: Family Medicine

## 2022-03-08 DIAGNOSIS — R109 Unspecified abdominal pain: Secondary | ICD-10-CM

## 2022-03-08 MED ORDER — IOPAMIDOL (ISOVUE-300) INJECTION 61%
100.0000 mL | Freq: Once | INTRAVENOUS | Status: AC | PRN
  Administered 2022-03-08: 100 mL via INTRAVENOUS

## 2022-08-16 ENCOUNTER — Other Ambulatory Visit: Payer: Self-pay | Admitting: Family Medicine

## 2022-08-16 DIAGNOSIS — R0600 Dyspnea, unspecified: Secondary | ICD-10-CM

## 2022-09-27 IMAGING — CR DG CHEST 2V
2 series · 2 of 2 positions shown · non-contrast
Comparison: Chest radiograph dated 02/20/2020.

CLINICAL DATA: Shortness of breath.

EXAM:
CHEST - 2 VIEW

[w chest pa]
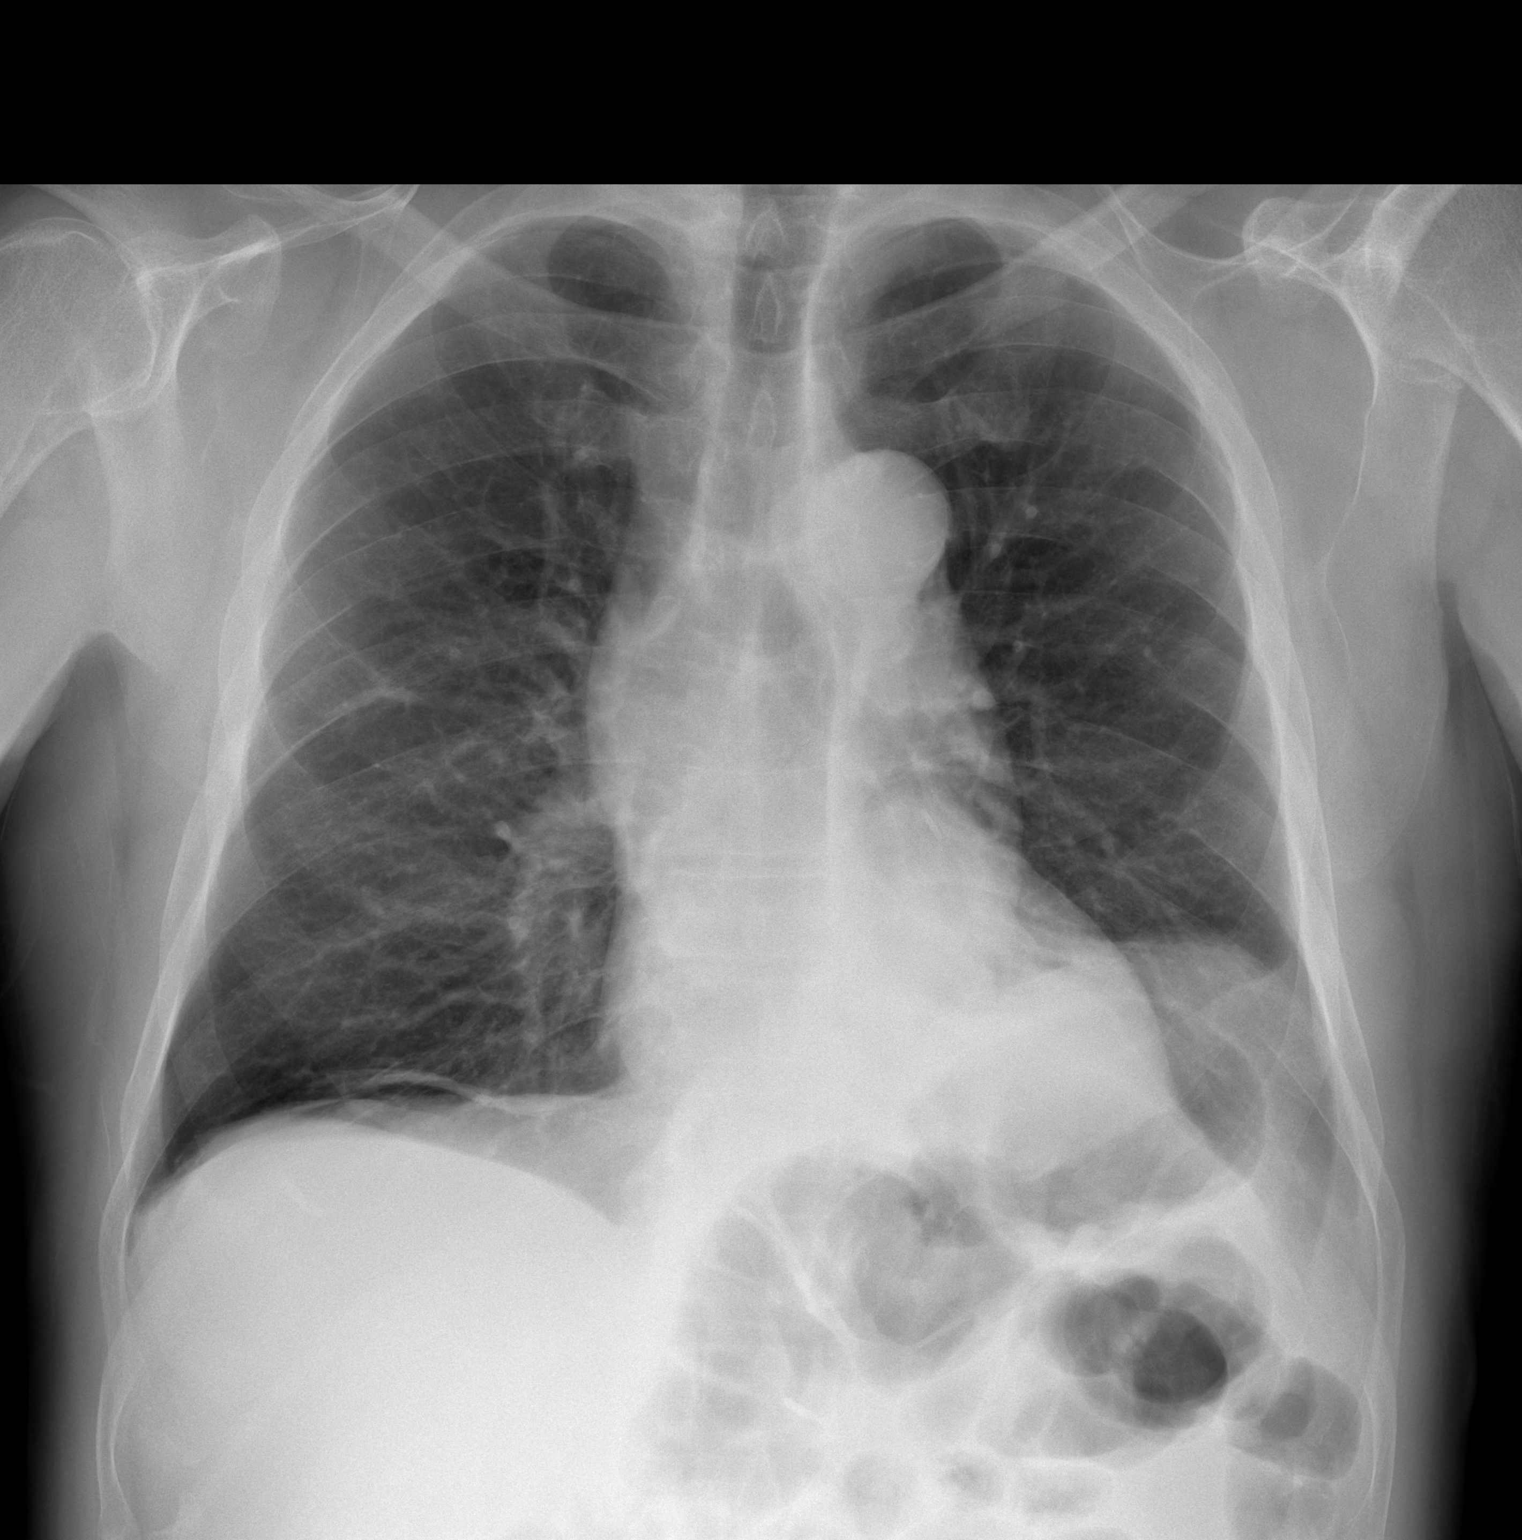

[w chest lat]
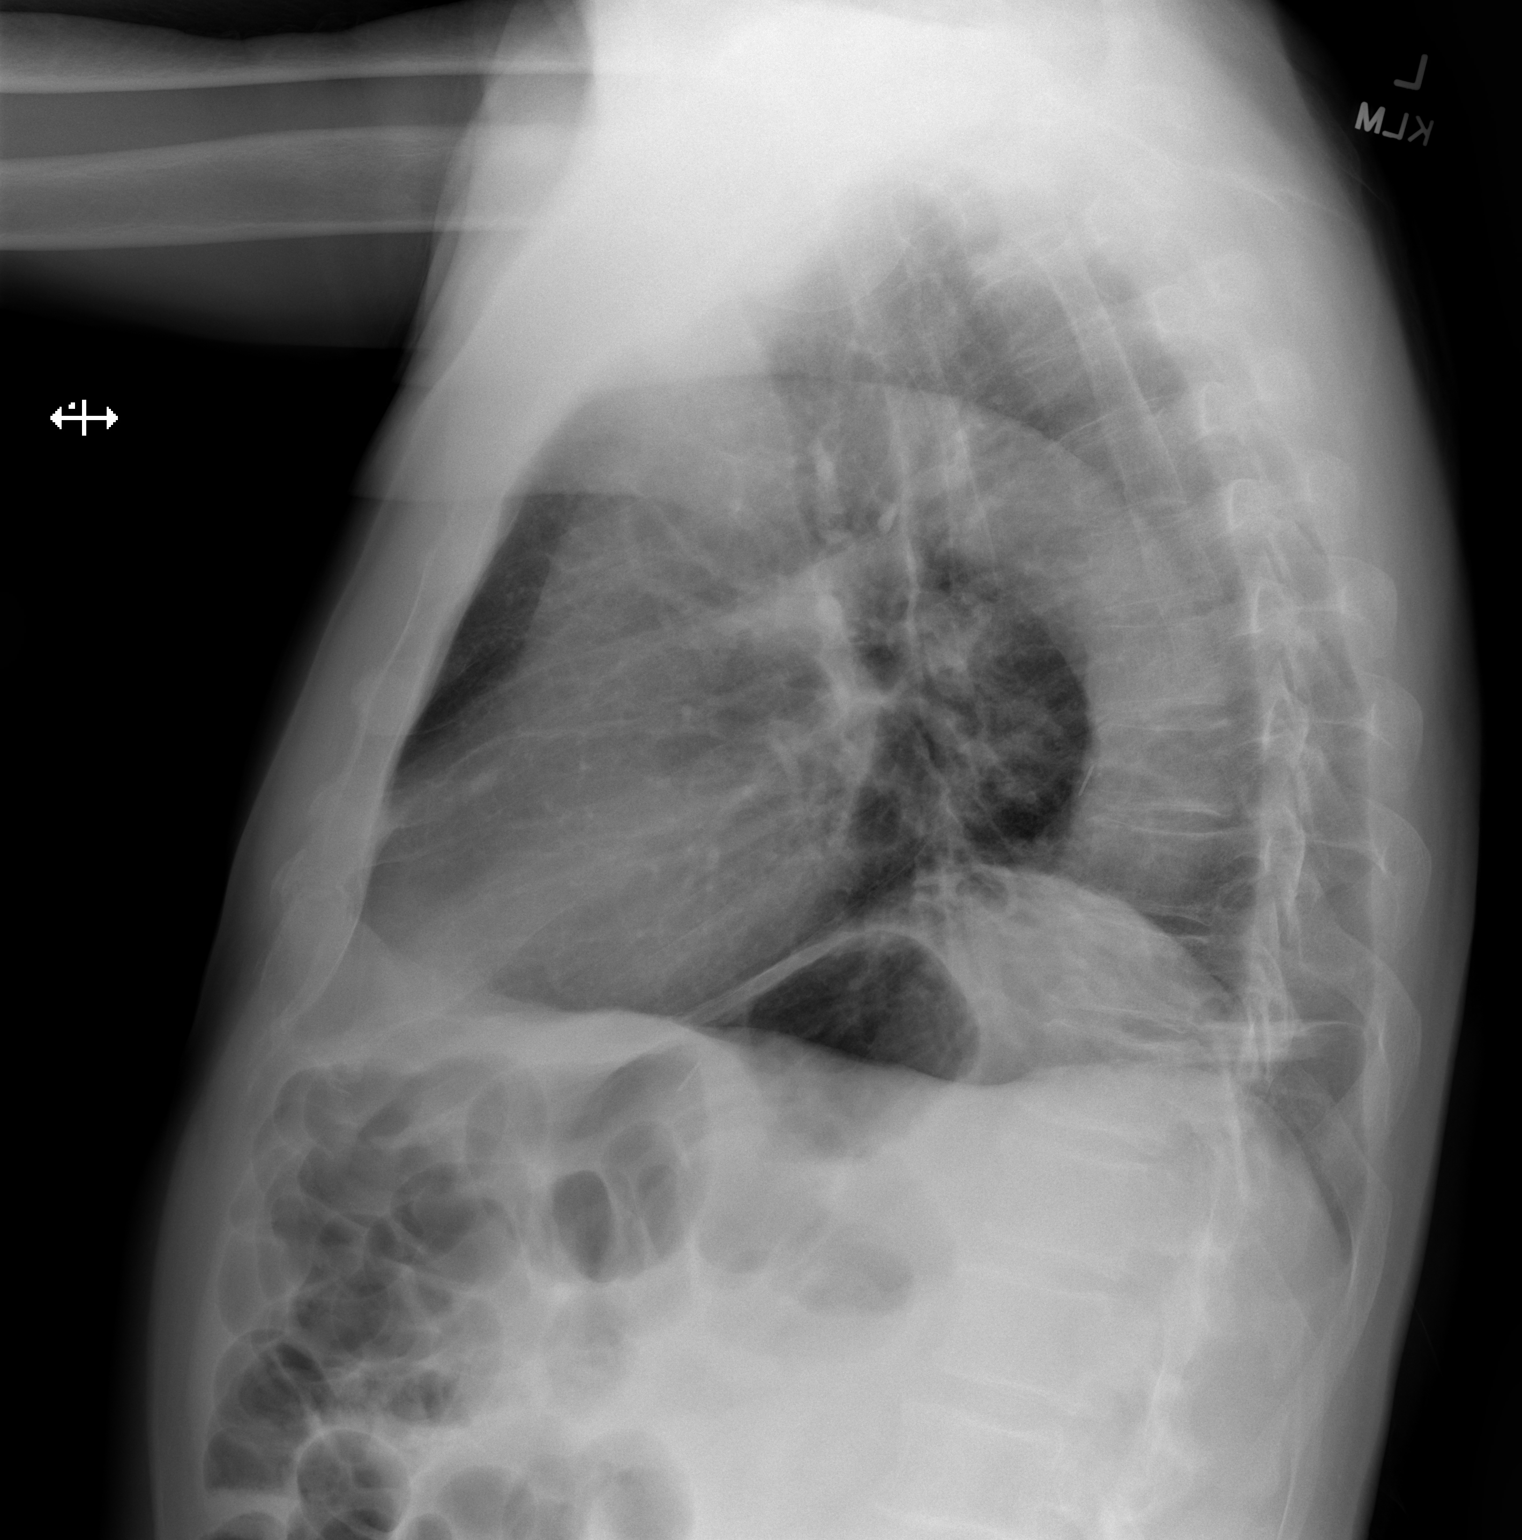

[2 of 2 positions shown; findings below may reference images not displayed]

FINDINGS: There is mild eventration of the left hemidiaphragm. There are
bibasilar atelectasis/scarring. No consolidative changes. There is
no pleural effusion or pneumothorax. The cardiac silhouette is
within normal limits. No acute osseous pathology.
IMPRESSION: No active cardiopulmonary disease.

## 2022-12-24 ENCOUNTER — Emergency Department (HOSPITAL_COMMUNITY): Payer: Medicare PPO

## 2022-12-24 ENCOUNTER — Encounter (HOSPITAL_COMMUNITY): Payer: Self-pay

## 2022-12-24 ENCOUNTER — Emergency Department (HOSPITAL_COMMUNITY)
Admission: EM | Admit: 2022-12-24 | Discharge: 2022-12-24 | Disposition: A | Discharge status: Home / Self Care | Payer: Medicare PPO | Attending: Emergency Medicine | Admitting: Emergency Medicine

## 2022-12-24 ENCOUNTER — Other Ambulatory Visit: Payer: Self-pay

## 2022-12-24 DIAGNOSIS — R55 Syncope and collapse: Secondary | ICD-10-CM | POA: Insufficient documentation

## 2022-12-24 DIAGNOSIS — R1030 Lower abdominal pain, unspecified: Secondary | ICD-10-CM | POA: Insufficient documentation

## 2022-12-24 DIAGNOSIS — R0600 Dyspnea, unspecified: Secondary | ICD-10-CM

## 2022-12-24 DIAGNOSIS — R7401 Elevation of levels of liver transaminase levels: Secondary | ICD-10-CM

## 2022-12-24 DIAGNOSIS — U071 COVID-19: Secondary | ICD-10-CM | POA: Diagnosis not present

## 2022-12-24 DIAGNOSIS — R4182 Altered mental status, unspecified: Secondary | ICD-10-CM

## 2022-12-24 DIAGNOSIS — R17 Unspecified jaundice: Secondary | ICD-10-CM

## 2022-12-24 DIAGNOSIS — R0981 Nasal congestion: Secondary | ICD-10-CM | POA: Diagnosis present

## 2022-12-24 LAB — COMPREHENSIVE METABOLIC PANEL
ALT: 135 U/L — ABNORMAL HIGH (ref 0–44)
AST: 99 U/L — ABNORMAL HIGH (ref 15–41)
Albumin: 3.1 g/dL — ABNORMAL LOW (ref 3.5–5.0)
Alkaline Phosphatase: 148 U/L — ABNORMAL HIGH (ref 38–126)
Anion gap: 6 (ref 5–15)
BUN: 9 mg/dL (ref 8–23)
CO2: 26 mmol/L (ref 22–32)
Calcium: 8.7 mg/dL — ABNORMAL LOW (ref 8.9–10.3)
Chloride: 101 mmol/L (ref 98–111)
Creatinine, Ser: 0.96 mg/dL (ref 0.61–1.24)
GFR, Estimated: >60 mL/min (ref >60)
Glucose, Bld: 129 mg/dL — ABNORMAL HIGH (ref 70–99)
Potassium: 3.9 mmol/L (ref 3.5–5.1)
Sodium: 133 mmol/L — ABNORMAL LOW (ref 135–145)
Total Bilirubin: 6.4 mg/dL — ABNORMAL HIGH (ref <1.2)
Total Protein: 6.5 g/dL (ref 6.5–8.1)

## 2022-12-24 LAB — CBC
HCT: 45.3 % (ref 39.0–52.0)
Hemoglobin: 15.9 g/dL (ref 13.0–17.0)
MCH: 29.9 pg (ref 26.0–34.0)
MCHC: 35.1 g/dL (ref 30.0–36.0)
MCV: 85.3 fL (ref 80.0–100.0)
Platelets: 183 10*3/uL (ref 150–400)
RBC: 5.31 MIL/uL (ref 4.22–5.81)
RDW: 14.3 % (ref 11.5–15.5)
WBC: 13.8 10*3/uL — ABNORMAL HIGH (ref 4.0–10.5)
nRBC: 0 % (ref 0.0–0.2)

## 2022-12-24 LAB — RESP PANEL BY RT-PCR (RSV, FLU A&B, COVID)  RVPGX2
Influenza A by PCR: NEGATIVE
Influenza B by PCR: NEGATIVE
Resp Syncytial Virus by PCR: NEGATIVE
SARS Coronavirus 2 by RT PCR: POSITIVE — AB

## 2022-12-24 LAB — TYPE AND SCREEN
ABO/RH(D): B POS
Antibody Screen: NEGATIVE

## 2022-12-24 LAB — POC OCCULT BLOOD, ED: Fecal Occult Bld: NEGATIVE

## 2022-12-24 LAB — URINALYSIS, ROUTINE W REFLEX MICROSCOPIC
Glucose, UA: NEGATIVE mg/dL
Hgb urine dipstick: NEGATIVE
Ketones, ur: NEGATIVE mg/dL
Leukocytes,Ua: NEGATIVE
Nitrite: NEGATIVE
Protein, ur: NEGATIVE mg/dL
Specific Gravity, Urine: >1.046 — ABNORMAL HIGH (ref 1.005–1.030)
pH: 5 (ref 5.0–8.0)

## 2022-12-24 LAB — LIPASE, BLOOD: Lipase: 22 U/L (ref 11–51)

## 2022-12-24 MED ORDER — SODIUM CHLORIDE 0.9 % IV SOLN: Freq: Once | INTRAVENOUS | Status: AC

## 2022-12-24 MED ORDER — IOHEXOL 350 MG/ML SOLN: 75.0000 mL | Freq: Once | INTRAVENOUS | Status: DC | PRN

## 2022-12-24 MED ORDER — IOHEXOL 350 MG/ML SOLN
70.0000 mL | Freq: Once | INTRAVENOUS | Status: AC | PRN
  Administered 2022-12-24: 70 mL via INTRAVENOUS

## 2022-12-24 MED ORDER — FENTANYL CITRATE PF 50 MCG/ML IJ SOSY
50.0000 ug | PREFILLED_SYRINGE | Freq: Once | INTRAMUSCULAR | Status: AC
  Administered 2022-12-24: 50 ug via INTRAVENOUS
  Filled 2022-12-24: qty 1

## 2022-12-24 NOTE — ED Notes (Signed)
Attempted to collect urine. Pt stated "could not get it, I tried". Pt will attempt again at a later time.

## 2022-12-24 NOTE — Discharge Instructions (Addendum)
You have been seen here in the emergency department for abdominal pain. We have obtained a full history, performed a physical exam, in addition to other diagnostic tests and treatments. Right now, we feel that you are safe for discharge from a medical perspective, and do not have an acute life threatening illness.   You have COVID-19, your bilirubin is elevated, you need to follow-up with your GI doctor, your urine does not have any signs of infection. Hepatitis panel is pending.  To do: 1.) Take all medications as prescribed.   2.) If anything changes, or you develop fevers, chills, inability to eat or drink, severe pain, new symptoms, return of symptoms, worsening of symptoms, or any other concerns, please call 911 or come back to the emergency department as soon as possible.   3.) Please make an appointment with your primary care doctor for a follow-up visit after being seen here in the emergency department. If you do not have a primary care doctor, you can call [phone] for assistance in finding one or your health care insurance company.    Thank you for allowing me to take care of you today. We hope that you feel better soon.

## 2022-12-24 NOTE — ED Provider Notes (Signed)
[place] EMERGENCY DEPARTMENT AT [facility] Provider Note   CSN: [id number] Arrival date & time: 12/24/22  4098     History No chief complaint on file.   Thomas Mcclain is a 77 y.o. male h/o BPH, esophageal ulcer, upper GI bleed, hiatal hernia presents to the emergency department today for evaluation of multiple complaints. Lower abdominal pain for the past week with darker stool. Denies any pain with defecation or bloody stools. Denies dairrhea or constipation. Reports his urine has been darker, but no pain or hematuria. Additionally, he mentions that he bent down to pick up something out a laundry basket and stood up, felt light headed, and passed out. He denies any chest pain or SOB before or afterwards. Denies palpations or fever. He doesn't think he hit is head, nor is he complaining of head pain. Reports some  paraspinal neck pain. He doesn't feel like he obtained any injury or pain from his syncopal episode. He feels like his esophagus is "swollen", but still able to eat and drink without issue. He has a history history of esophageal stricture with history of location.  Also has a history of esophageal ulcer, occasional compliance with his PPI.  Also reports nasal congestion, rhinorrhea, and ear fullness.  Reports he was exposed to COVID and has been having nasal congestion for a week. Reports he was concerned because he has "abdominal bleeding" a few years prior. Unknown if he received blood. Additionally, mentions he has been having hiccups since yesterday afternoon.     HPI     Home Medications Prior to Admission medications   Medication Sig Start Date End Date Taking? Authorizing Provider  benzonatate (TESSALON) 100 MG capsule Take 1 capsule (100 mg total) by mouth every 8 (eight) hours. 01/09/22   Radford Pax, NP  diphenhydrAMINE (SOMINEX) 25 MG tablet Take 25 mg by mouth at bedtime as needed for allergies or sleep.    [provider]  ferrous sulfate 325  (65 FE) MG tablet Take 325 mg daily by mouth.    [provider]  loperamide (IMODIUM A-D) 2 MG tablet Take 2 mg by mouth 4 (four) times daily as needed for diarrhea or loose stools.    [provider]  Multiple Vitamin (MULTIVITAMIN WITH MINERALS) TABS tablet Take 1 tablet by mouth daily.    [provider]  Omega-3 1000 MG CAPS Take 1 capsule by mouth daily.    [provider]  pantoprazole (PROTONIX) 40 MG tablet Take 1 tablet (40 mg total) by mouth 2 (two) times daily before a meal for 14 days. 02/22/20 03/07/20  Darlin Drop, DO  pantoprazole (PROTONIX) 40 MG tablet Take 1 tablet (40 mg total) by mouth daily. 03/07/20 06/05/20  Darlin Drop, DO  Propylene Glycol (SYSTANE BALANCE OP) Place 1 drop into both eyes daily as needed (dry eyes).    [provider]  vitamin B-12 (CYANOCOBALAMIN) 1000 MCG tablet Take 1,000 mcg daily by mouth.    [provider]      Allergies    Patient has no known allergies.    Review of Systems   Review of Systems  Constitutional:  Negative for chills and fever.  HENT:  Positive for congestion and rhinorrhea.   Respiratory:  Negative for cough and shortness of breath.   Cardiovascular:  Negative for chest pain and palpitations.  Gastrointestinal:  Positive for abdominal pain. Negative for blood in stool, constipation, diarrhea, nausea and vomiting.  Genitourinary:  Negative  for difficulty urinating, dysuria and hematuria.  Musculoskeletal:  Positive for neck pain. Negative for arthralgias, back pain and myalgias.  Neurological:  Positive for syncope. Negative for headaches.    Physical Exam Updated Vital Signs BP (!) 144/95 (BP Location: Right Arm)   Pulse 82   Temp 99 F (37.2 C) (Oral)   Resp 15   SpO2 95%  Physical Exam Vitals and nursing note reviewed. Exam conducted with a chaperone present (Bre, NT).  Constitutional:      General: He is not in acute distress.    Appearance: He is not  ill-appearing or toxic-appearing.  HENT:     Head: Normocephalic and atraumatic.     Comments: No battle signs or raccoon eyes.    Nose: No congestion or rhinorrhea.     Mouth/Throat:     Mouth: Mucous membranes are dry.     Comments: No pharyngeal erythema, edema, exudate noted.  Dry mucous membranes.  Jaundice sublingually. Eyes:     General: Scleral icterus present.     Comments: Slight scleral icterus.   Neck:     Comments: Some mild paraspinal tenderness to palpation. No midline tenderness to palpation. No signs of trauma.  Cardiovascular:     Rate and Rhythm: Normal rate.  Pulmonary:     Effort: Pulmonary effort is normal. No respiratory distress.     Breath sounds: Normal breath sounds.  Abdominal:     General: Bowel sounds are normal.     Palpations: Abdomen is soft.     Tenderness: There is abdominal tenderness. There is no guarding or rebound.     Comments: Mild lower abdominal tenderness to palpation. Soft. No guarding or rebound noted.   Genitourinary:    Comments: DRE reveal clay/brown stool. No melena or blood noted.  Musculoskeletal:     Right lower leg: No edema.     Left lower leg: No edema.  Skin:    General: Skin is warm and dry.  Neurological:     General: No focal deficit present.     Mental Status: He is alert. Mental status is at baseline.     Cranial Nerves: No dysarthria or facial asymmetry.     Comments: At baseline per family in the room.      ED Results / Procedures / Treatments   Labs (all labs ordered are listed, but only abnormal results are displayed) Labs Reviewed  RESP PANEL BY RT-PCR (RSV, FLU A&B, COVID)  RVPGX2 - Abnormal; Notable for the following components:      Result Value   SARS Coronavirus 2 by RT PCR POSITIVE (*)    All other components within normal limits  COMPREHENSIVE METABOLIC PANEL - Abnormal; Notable for the following components:   Sodium 133 (*)    Glucose, Bld 129 (*)    Calcium 8.7 (*)    Albumin 3.1 (*)    AST  99 (*)    ALT 135 (*)    Alkaline Phosphatase 148 (*)    Total Bilirubin 6.4 (*)    All other components within normal limits  CBC - Abnormal; Notable for the following components:   WBC 13.8 (*)    All other components within normal limits  LIPASE, BLOOD  URINALYSIS, ROUTINE W REFLEX MICROSCOPIC  POC OCCULT BLOOD, ED  TYPE AND SCREEN    EKG None  Radiology CT ABDOMEN PELVIS W CONTRAST Result Date: 12/24/2022 CLINICAL DATA:  Esophageal pain. Burning abdominal pain. History of GI bleed. EXAM: CT ABDOMEN AND  PELVIS WITH CONTRAST TECHNIQUE: Multidetector CT imaging of the abdomen and pelvis was performed using the standard protocol following bolus administration of intravenous contrast. RADIATION DOSE REDUCTION: This exam was performed according to the departmental dose-optimization program which includes automated exposure control, adjustment of the mA and/or kV according to patient size and/or use of iterative reconstruction technique. CONTRAST:  70mL OMNIPAQUE IOHEXOL 350 MG/ML SOLN COMPARISON:  03/08/2022 FINDINGS: Lower chest: Dependent atelectasis or scarring noted in both lower lungs. Hepatobiliary: No suspicious focal abnormality within the liver parenchyma. There is no evidence for gallstones, gallbladder wall thickening, or pericholecystic fluid. No intrahepatic or extrahepatic biliary dilation. Pancreas: No focal mass lesion. No dilatation of the main duct. No intraparenchymal cyst. No peripancreatic edema. Spleen: No splenomegaly. No suspicious focal mass lesion. Adrenals/Urinary Tract: No adrenal nodule or mass. Right kidney unremarkable. Stable 5.2 cm exophytic cyst interpolar left kidney. No followup imaging is recommended. No evidence for hydroureter. The urinary bladder appears normal for the degree of distention. Stomach/Bowel: Changes in the region of the esophagogastric junction suggest prior fundoplication. Moderate hiatal hernia includes the wrapped stomach. Duodenum is  normally positioned as is the ligament of Treitz. No small bowel wall thickening. No small bowel dilatation. The terminal ileum is normal. The appendix is not well visualized, but there is no edema or inflammation in the region of the cecal tip to suggest appendicitis. No gross colonic mass. No colonic wall thickening. Vascular/Lymphatic: There is mild atherosclerotic calcification of the abdominal aorta without aneurysm. There is no gastrohepatic or hepatoduodenal ligament lymphadenopathy. No retroperitoneal or mesenteric lymphadenopathy. No pelvic sidewall lymphadenopathy. Reproductive: Prostate gland not clearly visible suggesting previous resection. Other: Trace free fluid is seen in the pelvis. Musculoskeletal: No worrisome lytic or sclerotic osseous abnormality. IMPRESSION: 1. No acute findings in the abdomen or pelvis. Specifically, no findings to explain the patient's history of GI bleed. 2. Changes in the region of the esophagogastric junction suggest prior fundoplication. Moderate hiatal hernia includes the wrapped stomach. 3. Trace free fluid in the pelvis. 4.  Aortic Atherosclerosis (ICD10-I70.0). Electronically Signed   By: Kennith Center M.D.   On: 12/24/2022 12:56   CT CERVICAL SPINE WO CONTRAST Result Date: 12/24/2022 CLINICAL DATA:  Syncope.  Sinus pain. EXAM: CT CERVICAL SPINE WITHOUT CONTRAST TECHNIQUE: Multidetector CT imaging of the cervical spine was performed without intravenous contrast. Multiplanar CT image reconstructions were also generated. RADIATION DOSE REDUCTION: This exam was performed according to the departmental dose-optimization program which includes automated exposure control, adjustment of the mA and/or kV according to patient size and/or use of iterative reconstruction technique. COMPARISON:  02/20/2020 FINDINGS: Alignment: Normal. Skull base and vertebrae: No acute fracture. No primary bone lesion or focal pathologic process. Soft tissues and spinal canal: No prevertebral  fluid or swelling. No visible canal hematoma. Disc levels: Mild loss of disc height noted C6-7. Facets are well aligned bilaterally. Upper chest: Unremarkable. Other: None. IMPRESSION: 1. No acute fracture or subluxation of the cervical spine. 2. Mild loss of disc height at C6-7. Electronically Signed   By: Kennith Center M.D.   On: 12/24/2022 12:50   CT HEAD WO CONTRAST ( ) Result Date: 12/24/2022 CLINICAL DATA:  Syncope EXAM: CT HEAD WITHOUT CONTRAST TECHNIQUE: Contiguous axial images were obtained from the base of the skull through the vertex without intravenous contrast. RADIATION DOSE REDUCTION: This exam was performed according to the departmental dose-optimization program which includes automated exposure control, adjustment of the mA and/or kV according to patient size and/or use of iterative  reconstruction technique. COMPARISON:  None Available.  02/20/2020 FINDINGS: Brain: There is no evidence for acute hemorrhage, hydrocephalus, mass lesion, or abnormal extra-axial fluid collection. No definite CT evidence for acute infarction. Diffuse loss of parenchymal volume is consistent with atrophy. Patchy low attenuation in the deep hemispheric and periventricular white matter is nonspecific, but likely reflects chronic microvascular ischemic demyelination. Vascular: No hyperdense vessel or unexpected calcification. Skull: No evidence for fracture. No worrisome lytic or sclerotic lesion. Sinuses/Orbits: The visualized paranasal sinuses and mastoid air cells are clear. Visualized portions of the globes and intraorbital fat are unremarkable. Other: None. IMPRESSION: 1. Stable.  No acute intracranial abnormality. 2. Atrophy with chronic small vessel ischemic disease. Electronically Signed   By: Kennith Center M.D.   On: 12/24/2022 12:15    Procedures Procedures   Medications Ordered in ED Medications  iohexol (OMNIPAQUE) 350 MG/ML injection 75 mL ( Intravenous Canceled Entry 12/24/22 1236)  iohexol  (OMNIPAQUE) 350 MG/ML injection 70 mL (70 mLs Intravenous Contrast Given 12/24/22 1106)    ED Course/ Medical Decision Making/ A&P Clinical Course as of 12/24/22 1512  Sat Dec 24, 2022  1458 Stable 10M esophageal ulcers, hiatal hernia, syncopal episode yesterday, picking something down, sycnopized, lightehaded before no cp or sob beforehand. Only has lower abdominal pain. Pt is stable. CTH and ct cervical spine negative. Covid +. Bilirubin is elevated and transaminitis, CT abd pelvis normal.   Hiccups since yesterday   GI - hepatitis panel and follow up outpatient.  Follow up urine. History of GI bleed, upper.  Heme+.  [JL]  1459 Spoke with Dr. Dulce Sellar with [facility]. He does not see any dilation. Would like acute hepatitis panel and can follow up with them as an outpatient.  [RR]    Clinical Course User Index [JL] Gunnar Bulla, MD [RR] Achille Rich, PA-C   Medical Decision Making Amount and/or Complexity of Data Reviewed Labs: ordered. Radiology: ordered.  Risk Prescription drug management.   77 y.o. male presents to the ER for evaluation of multiple complaints. Differential diagnosis includes but is not limited to intra-abdominal etiology, hypovolemia, cardiogenic, viral illness, etc.. Vital signs mildly elevated BP at 144/95 otherwise unremarkable. Physical exam as noted above.   I independently reviewed and interpreted the patient's labs.  CBC shows slightly low white blood cell count of 13.8.  No anemia.  CMP shows sodium 133, glucose 129.  Patient has a mildly decreased calcium at 8.7.  Mildly decreased albumin at 3.1.  Patient has new transaminitis with elevation in AST at 99, ALT 135, alk phos at 148, and a total bili of 6.4.  Patient is COVID-positive.  Lipase within normal limits.  Hemoccult negative.  CT Abd  1. No acute findings in the abdomen or pelvis. Specifically, no findings to explain the patient's history of GI bleed. 2. Changes in the region of the esophagogastric  junction suggest prior fundoplication. Moderate hiatal hernia includes the wrapped stomach. 3. Trace free fluid in the pelvis. 4.  Aortic Atherosclerosis. Per radiologist's interpretation.    CT head and c-spine shows 1. Stable.  No acute intracranial abnormality. 2. Atrophy with chronic small vessel ischemic disease. Per radiologist's interpretation.  1. No acute fracture or subluxation of the cervical spine. 2. Mild loss of disc height at C6-7. Per radiologist's interpretation.    Given the new transaminitis and elevated total bilirubin, I consulted the [facility].  Patient sees Dr. Ewing Schlein outpatient.  I did with Dr. Dulce Sellar.  He does not see any ductal  dilation.  Does not think any acute inpatient workup needs to be performed.  Would like to add an acute hepatitis panel and can follow-up with him as an outpatient.  I have started the patient on a normal saline infusion for questionable hypovolemia causing his syncopal episode.  Patient still not able to urinate.  Question if this is more dehydration or some urinary retention.  Patient reports that he urinated fine this morning.  Bladder scan ordered as well.  Patient is out of the window for any COVID treatment given he has had the symptoms for the past week.  Will need to follow-up with urinalysis and hepatitis panel.  Handoff oncoming shift.  4:01 PM Care of Thomas Mcclain transferred to Dr. Gunnar Bulla (resident) and Dr. Eber Hong at the end of my shift as the patient will require reassessment once labs/imaging have resulted. Patient presentation, ED course, and plan of care discussed with review of all pertinent labs and imaging. Please see his/her note for further details regarding further ED course and disposition. Plan at time of handoff is follow up with urinalysis and hiccups and bladder scan. This may be altered or completely changed at the discretion of the oncoming team pending results of further workup.  Portions of this report may have  been transcribed using voice recognition software. Every effort was made to ensure accuracy; however, inadvertent computerized transcription errors may be present.   Final Clinical Impression(s) / ED Diagnoses Final diagnoses:  COVID    Rx / DC Orders ED Discharge Orders     None         Achille Rich, PA-C 12/24/22 1608    Gloris Manchester, MD 12/24/22 [phone]

## 2022-12-24 NOTE — ED Provider Triage Note (Addendum)
Emergency Medicine Provider Triage Evaluation Note  Thomas Mcclain , a 77 y.o. male  was evaluated in triage.  Pt complains of lower abdominal pain for the past week with darker stool. Denies any pain with defecation or bloody stools. Denies dairrhea or constipation. Reports his urine has been darker, but no pain or hematuria. Additionally, he mentions that he bent down to pick up something out a laundry basket and stood up, felt light headed, and passed out. He denies any chest pain or SOB before or afterwards. Denies palpations or fever. He doesn't think he hit is head, nor is he complaining of head pain. Reports some neck pain. He doesn't feel like he obtained any injury or pain from his syncopal episode. He feels like his esophagus is "swollen", but still able to eat and drink without issue. Allso reports nasal congestion, rhinorrhea, and ear fullness. Reports he was concerned because he has "abdominal bleeding" a few years prior. Unknown if he received blood.   Review of Systems  Positive:  Negative:   Physical Exam  BP (!) 128/100   Pulse 98   Temp 98.5 F (36.9 C) (Oral)   Resp 14   SpO2 96%  Gen:   Awake, no distress   Resp:  Normal effort  MSK:   Moves extremities without difficulty  Other:  Speaking in full sentences without issue. Mild lower abdominal tenderness to palpation. Soft. Does appear pale with pal conjunctiva, unknown baseline.   Medical Decision Making  Medically screening exam initiated at 10:01 AM.  Appropriate orders placed.  Kalel Haddow was informed that the remainder of the evaluation will be completed by another provider, this initial triage assessment does not replace that evaluation, and the importance of remaining in the ED until their evaluation is complete.  CT and labs ordered.    Achille Rich, PA-C 12/24/22 1007    Achille Rich, [place] 12/24/22 1010

## 2022-12-24 NOTE — ED Notes (Signed)
Pt felt warm when applying leads and temp was checked, pt 99 oral

## 2022-12-24 NOTE — ED Notes (Signed)
Hepatic panel added on by Theodoro Grist in lab

## 2022-12-24 NOTE — ED Provider Notes (Signed)
  [place] EMERGENCY DEPARTMENT AT [facility] Provider Note   Medical Decision Making     Clinical Course as of 12/24/22 1936  Sat Dec 24, 2022  1458 Stable 29M esophageal ulcers, hiatal hernia, syncopal episode yesterday, picking something down, sycnopized, lightehaded before no cp or sob beforehand. Only has lower abdominal pain. Pt is stable. CTH and ct cervical spine negative. Covid +. Bilirubin is elevated and transaminitis, CT abd pelvis normal.   Hiccups since yesterday   GI - hepatitis panel and follow up outpatient.  Follow up urine. History of GI bleed, upper.  Heme+.  [JL]  1459 Spoke with Dr. Dulce Sellar with [facility]. He does not see any dilation. Would like acute hepatitis panel and can follow up with them as an outpatient.  [RR]    Clinical Course User Index [JL] Gunnar Bulla, MD [RR] Achille Rich, PA-C    I assumed care with the patient above, I was told to follow-up on the urine which shows no signs of infection.  Patient is no longer hiccuping.  Will discharge the patient at this time, he has transaminitis, hepatitis panel has been sent even though does not say that in the computer per nursing has been sent, and he has close follow-up with GI already.  Hemoccult test was negative, no active signs of GI bleed, additionally CT and CT spine were negative, CT abdomen pelvis does not show any reason why the bilirubin would be high.  He is tolerating p.o. intake, will discharge this patient with hepatitis panel pending which I will follow-up on.   At this time, I feel that the patient is medically cleared for discharge and have discussed this with my attending who agrees.  I discussed with the patient and/or family my overall assessment, including my physical exam, labs, imaging, other diagnostic tests, and therapeutics given.  All questions answered and understanding is expressed.  I have instructed to call PCP to establish an outpatient appointment after this ED  visit, and necessary specialty follow up if needed. I gave strict return precautions to come back to the ED including fevers, chills, severe pain, worsening of symptoms, return of symptoms, new and concerning symptoms, inability to tolerate p.o. intake, among others. I specifically stated to return if symptoms worsen or return.    The patient was seen, evaluated, and treated in conjunction with the attending physician, who voiced agreement in the care provided.  Note generated using Dragon voice dictation software and may contain dictation errors. Please contact me for any clarification or with any questions.   Electronically signed by:  Osvaldo Shipper, M.D. (PGY-2)    Gunnar Bulla, MD 12/24/22 Audelia Acton    Eber Hong, MD 12/25/22 [phone]

## 2022-12-24 NOTE — ED Notes (Signed)
Hepatic panel sent to lab.

## 2022-12-24 NOTE — ED Triage Notes (Signed)
Patient arrived by [facility] from home with complaint of abdominal pain since Monday- Patient denies nausea/vomiting but complains of burning pain. Has hx of GI bleed. Also request covid test due to exposure, bilateral ear pain, sinus pressure. Also complains of esophageal pain No thinners, alert and oriented. Reports that he was going to [place] yesterday and had syncopal episode on way to car, denies inury

## 2022-12-24 NOTE — ED Notes (Signed)
Pt states he will urinate as soon as possible. Son in room states patient rarely drinks fluids.

## 2022-12-29 ENCOUNTER — Other Ambulatory Visit: Payer: Self-pay | Admitting: Gastroenterology

## 2022-12-29 DIAGNOSIS — R1310 Dysphagia, unspecified: Secondary | ICD-10-CM

## 2023-01-25 ENCOUNTER — Encounter (INDEPENDENT_AMBULATORY_CARE_PROVIDER_SITE_OTHER): Payer: Self-pay | Admitting: Otolaryngology

## 2023-05-16 ENCOUNTER — Ambulatory Visit: Admitting: Podiatry
